# Patient Record
Sex: Female | Born: 1985 | Race: White | Hispanic: No | Marital: Married | Smoking: Former smoker
Health system: Southern US, Community
[De-identification: ages and names within clinical notes are randomized; demographics above are authoritative.]

## PROBLEM LIST (undated history)

## (undated) ENCOUNTER — Inpatient Hospital Stay: Payer: Self-pay

## (undated) DIAGNOSIS — R011 Cardiac murmur, unspecified: Secondary | ICD-10-CM

## (undated) DIAGNOSIS — M549 Dorsalgia, unspecified: Secondary | ICD-10-CM

## (undated) DIAGNOSIS — D649 Anemia, unspecified: Secondary | ICD-10-CM

## (undated) DIAGNOSIS — O139 Gestational [pregnancy-induced] hypertension without significant proteinuria, unspecified trimester: Secondary | ICD-10-CM

## (undated) HISTORY — PX: NO PAST SURGERIES: SHX2092

## (undated) HISTORY — PX: CERVICAL BIOPSY  W/ LOOP ELECTRODE EXCISION: SUR135

## (undated) HISTORY — PX: ABDOMINAL HYSTERECTOMY: SHX81

---

## 2009-06-30 ENCOUNTER — Ambulatory Visit: Payer: Self-pay

## 2009-08-26 ENCOUNTER — Emergency Department: Payer: Self-pay | Admitting: Emergency Medicine

## 2010-05-05 ENCOUNTER — Ambulatory Visit: Payer: Self-pay | Admitting: Family Medicine

## 2010-10-08 ENCOUNTER — Ambulatory Visit: Payer: Self-pay | Admitting: Family Medicine

## 2012-08-18 ENCOUNTER — Emergency Department: Payer: Self-pay | Admitting: Internal Medicine

## 2012-09-29 ENCOUNTER — Emergency Department: Payer: Self-pay | Admitting: Emergency Medicine

## 2013-04-24 ENCOUNTER — Emergency Department: Payer: Self-pay | Admitting: Emergency Medicine

## 2013-04-24 LAB — BASIC METABOLIC PANEL
Anion Gap: 7 (ref 7–16)
BUN: 8 mg/dL (ref 7–18)
CALCIUM: 9 mg/dL (ref 8.5–10.1)
Chloride: 104 mmol/L (ref 98–107)
Co2: 23 mmol/L (ref 21–32)
Creatinine: 0.78 mg/dL (ref 0.60–1.30)
EGFR (African American): >60
EGFR (Non-African Amer.): >60
Glucose: 91 mg/dL (ref 65–99)
OSMOLALITY: 266 (ref 275–301)
Potassium: 3.4 mmol/L — ABNORMAL LOW (ref 3.5–5.1)
SODIUM: 134 mmol/L — AB (ref 136–145)

## 2013-04-24 LAB — CBC WITH DIFFERENTIAL/PLATELET
BASOS PCT: 0.2 %
Basophil #: 0 10*3/uL (ref 0.0–0.1)
Eosinophil #: 0.1 10*3/uL (ref 0.0–0.7)
Eosinophil %: 0.8 %
HCT: 37.2 % (ref 35.0–47.0)
HGB: 12.8 g/dL (ref 12.0–16.0)
Lymphocyte #: 0.5 10*3/uL — ABNORMAL LOW (ref 1.0–3.6)
Lymphocyte %: 5.9 %
MCH: 27.5 pg (ref 26.0–34.0)
MCHC: 34.6 g/dL (ref 32.0–36.0)
MCV: 80 fL (ref 80–100)
MONO ABS: 0.5 x10 3/mm (ref 0.2–0.9)
MONOS PCT: 4.9 %
NEUTROS ABS: 8.2 10*3/uL — AB (ref 1.4–6.5)
NEUTROS PCT: 88.2 %
Platelet: 199 10*3/uL (ref 150–440)
RBC: 4.67 10*6/uL (ref 3.80–5.20)
RDW: 13.4 % (ref 11.5–14.5)
WBC: 9.3 10*3/uL (ref 3.6–11.0)

## 2013-04-24 LAB — RAPID INFLUENZA A&B ANTIGENS (ARMC ONLY)

## 2015-04-19 NOTE — L&D Delivery Note (Signed)
Obstetrical Delivery Note   Date of Delivery:   01/12/2016 Primary OB:   [facility] Gestational Age/EDD: [id number]  Antepartum complications: none Intrapartum complications: None Delivered By:   TKB, CNM Delivery Type:   spontaneous vaginal delivery  Anesthesia:    epidural  GBS:    Negative Laceration:    none Episiotomy:    none Placenta:    Delivered via active management. To pathology: no.  Estimated Blood Loss:  400mL Baby's Name:   Stefanie Mason  Baby:    Liveborn female, APGARs 5/9, weight 3200gm   Delivery Details:   Very quick second stage. Tight nuchal cord, unable to be reduced, infant sommersaulted through cord. Infant to maternal abdomen, cut by FOB after 1 minute as infant required further assessment at bedside warmer.

## 2015-05-07 ENCOUNTER — Encounter: Payer: Self-pay | Admitting: Emergency Medicine

## 2015-05-07 ENCOUNTER — Emergency Department
Admission: EM | Admit: 2015-05-07 | Discharge: 2015-05-07 | Discharge status: Against Medical Advice | Payer: Medicaid Other | Attending: Emergency Medicine | Admitting: Emergency Medicine

## 2015-05-07 ENCOUNTER — Emergency Department
Admission: EM | Admit: 2015-05-07 | Discharge: 2015-05-07 | Disposition: A | Discharge status: Home / Self Care | Payer: Medicaid Other | Attending: Emergency Medicine | Admitting: Emergency Medicine

## 2015-05-07 DIAGNOSIS — R1110 Vomiting, unspecified: Secondary | ICD-10-CM | POA: Insufficient documentation

## 2015-05-07 DIAGNOSIS — R52 Pain, unspecified: Secondary | ICD-10-CM | POA: Diagnosis not present

## 2015-05-07 DIAGNOSIS — R112 Nausea with vomiting, unspecified: Secondary | ICD-10-CM | POA: Insufficient documentation

## 2015-05-07 DIAGNOSIS — R509 Fever, unspecified: Secondary | ICD-10-CM | POA: Diagnosis present

## 2015-05-07 DIAGNOSIS — J069 Acute upper respiratory infection, unspecified: Secondary | ICD-10-CM | POA: Insufficient documentation

## 2015-05-07 HISTORY — DX: Anemia, unspecified: D64.9

## 2015-05-07 LAB — RAPID INFLUENZA A&B ANTIGENS: Influenza A (ARMC): NEGATIVE

## 2015-05-07 LAB — RAPID INFLUENZA A&B ANTIGENS (ARMC ONLY): INFLUENZA B (ARMC): NEGATIVE

## 2015-05-07 MED ORDER — PROMETHAZINE HCL 25 MG PO TABS: 25.0000 mg | ORAL_TABLET | Freq: Four times a day (QID) | ORAL | Status: DC | PRN

## 2015-05-07 MED ORDER — ACETAMINOPHEN 500 MG PO TABS
ORAL_TABLET | ORAL | Status: AC
  Filled 2015-05-07: qty 2

## 2015-05-07 MED ORDER — GUAIFENESIN-CODEINE 100-10 MG/5ML PO SOLN
10.0000 mL | ORAL | Status: DC | PRN
Start: 2015-05-07 — End: 2016-01-09

## 2015-05-07 MED ORDER — CHLORPHENIRAMINE MALEATE 4 MG PO TABS: 4.0000 mg | ORAL_TABLET | Freq: Two times a day (BID) | ORAL | Status: DC | PRN

## 2015-05-07 MED ORDER — AZITHROMYCIN 250 MG PO TABS: ORAL_TABLET | ORAL | Status: DC

## 2015-05-07 MED ORDER — ACETAMINOPHEN 500 MG PO TABS
1000.0000 mg | ORAL_TABLET | Freq: Once | ORAL | Status: AC
  Administered 2015-05-07: 1000 mg via ORAL

## 2015-05-07 NOTE — ED Provider Notes (Signed)
[facility] Emergency Department Provider Note  ____________________________________________  Time seen: Approximately 7:49 AM  I have reviewed the triage vital signs and the nursing notes.   HISTORY  Chief Complaint Influenza    HPI Stefanie Mason is a 30 y.o. female presents for evaluation of fever chills cough weakness vomiting since yesterday 3. Patient states that symptoms were sudden in onset took 2 Advil prior to arrival. Patient states that she felt like this when she had the flu several years ago. Was brought in by EMS at 1:30 this morning but fell sleep in the waiting room and didn't get seen until current time of note.   Past Medical History  Diagnosis Date  . Anemia     There are no active problems to display for this patient.   History reviewed. No pertinent past surgical history.  Current Outpatient Rx  Name  Route  Sig  Dispense  Refill  . azithromycin (ZITHROMAX Z-PAK) 250 MG tablet      Take 2 tablets (500 mg) on  Day 1,  followed by 1 tablet (250 mg) once daily on Days 2 through 5.   6 each   0   . chlorpheniramine (CHLOR-TRIMETON) 4 MG tablet   Oral   Take 1 tablet (4 mg total) by mouth 2 (two) times daily as needed for allergies or rhinitis.   30 tablet   0   . guaiFENesin-codeine 100-10 MG/5ML syrup   Oral   Take 10 mLs by mouth every 4 (four) hours as needed for cough.   180 mL   0   . promethazine (PHENERGAN) 25 MG tablet   Oral   Take 1 tablet (25 mg total) by mouth every 6 (six) hours as needed for nausea or vomiting.   8 tablet   0     Allergies Review of patient's allergies indicates no known allergies.  No family history on file.  Social History Social History  Substance Use Topics  . Smoking status: Never Smoker   . Smokeless tobacco: None  . Alcohol Use: No    Review of Systems Constitutional: Positive fever/chills Eyes: No visual changes. ENT: Minimal sore throat. Cardiovascular: Denies  chest pain. Respiratory: Denies shortness of breath. Positive for cough Gastrointestinal: No abdominal pain.  Positive nausea, positive vomiting.  No diarrhea.  No constipation. Genitourinary: Negative for dysuria. Musculoskeletal: Negative for back pain. Skin: Negative for rash. Neurological: Positive for headaches, negative for focal weakness or numbness.  10-point ROS otherwise negative.  ____________________________________________   PHYSICAL EXAM:  VITAL SIGNS: ED Triage Vitals  Enc Vitals Group     BP 05/07/15 0732 127/71 mmHg     Pulse Rate 05/07/15 0732 115     Resp 05/07/15 0732 18     Temp 05/07/15 0732 100.1 F (37.8 C)     Temp Source 05/07/15 0732 Oral     SpO2 05/07/15 0732 100 %     Weight 05/07/15 0732 135 lb (61.236 kg)     Height 05/07/15 0732 5' (1.524 m)     Head Cir --      Peak Flow --      Pain Score 05/07/15 0732 7     Pain Loc --      Pain Edu? --      Excl. in GC? --     Constitutional: Alert and oriented. Well appearing and in no acute distress. Eyes: Conjunctivae are normal. PERRL. EOMI. Head: Atraumatic. Nose: Positive congestion/rhinnorhea. Mouth/Throat: Mucous membranes are  moist.  Oropharynx non-erythematous. Neck: No stridor.   Cardiovascular: Normal rate, regular rhythm. Grossly normal heart sounds.  Good peripheral circulation. Respiratory: Normal respiratory effort.  No retractions. Lungs CTAB. Gastrointestinal: Soft and nontender. No distention. No abdominal bruits. No CVA tenderness. Musculoskeletal: No lower extremity tenderness nor edema.  No joint effusions. Neurologic:  Normal speech and language. No gross focal neurologic deficits are appreciated. No gait instability. Skin:  Skin is warm, dry and intact. No rash noted. Psychiatric: Mood and affect are normal. Speech and behavior are normal.  ____________________________________________   LABS (all labs ordered are listed, but only abnormal results are displayed)  Labs  Reviewed  RAPID INFLUENZA A&B ANTIGENS ([facility] ONLY)   ____________________________________________    PROCEDURES  Procedure(s) performed: None  Critical Care performed: No  ____________________________________________   INITIAL IMPRESSION / ASSESSMENT AND PLAN / ED COURSE  Pertinent labs & imaging results that were available during my care of the patient were reviewed by me and considered in my medical decision making (see chart for details).  Acute upper respiratory infection. Rx Zithromax, chlorpheniramine, Robitussin-AC, and Phenergan. Patient follow-up with PCP or return to the ER with any worsening symptomology.  Patient voices no other emergency medical complaints at this time. ____________________________________________   FINAL CLINICAL IMPRESSION(S) / ED DIAGNOSES  Final diagnoses:  URI, acute      Evangeline Dakin, PA-C 05/07/15 0902  Richardean Canal, MD 05/07/15 1520

## 2015-05-07 NOTE — ED Notes (Addendum)
Pt came in by ems and fell asleep in lobby, missing her call. She states she has been having chills, weakness, coughing, vomiting since yesterday (x3). No coughing during assessment. Pt alert & oriented. Pt took 2 advil pta (before 0130).  States last time she felt like this she had the flu (2-3 years ago).

## 2015-05-07 NOTE — Discharge Instructions (Signed)
Upper Respiratory Infection, Adult Most upper respiratory infections (URIs) are a viral infection of the air passages leading to the lungs. A URI affects the nose, throat, and upper air passages. The most common type of URI is nasopharyngitis and is typically referred to as "the common cold." URIs run their course and usually go away on their own. Most of the time, a URI does not require medical attention, but sometimes a bacterial infection in the upper airways can follow a viral infection. This is called a secondary infection. Sinus and middle ear infections are common types of secondary upper respiratory infections. Bacterial pneumonia can also complicate a URI. A URI can worsen asthma and chronic obstructive pulmonary disease (COPD). Sometimes, these complications can require emergency medical care and may be life threatening.  CAUSES Almost all URIs are caused by viruses. A virus is a type of germ and can spread from one person to another.  RISKS FACTORS You may be at risk for a URI if:   You smoke.   You have chronic heart or lung disease.  You have a weakened defense (immune) system.   You are very young or very old.   You have nasal allergies or asthma.  You work in crowded or poorly ventilated areas.  You work in health care facilities or schools. SIGNS AND SYMPTOMS  Symptoms typically develop 2-3 days after you come in contact with a cold virus. Most viral URIs last 7-10 days. However, viral URIs from the influenza virus (flu virus) can last 14-18 days and are typically more severe. Symptoms may include:   Runny or stuffy (congested) nose.   Sneezing.   Cough.   Sore throat.   Headache.   Fatigue.   Fever.   Loss of appetite.   Pain in your forehead, behind your eyes, and over your cheekbones (sinus pain).  Muscle aches.  DIAGNOSIS  Your health care provider may diagnose a URI by:  Physical exam.  Tests to check that your symptoms are not due to  another condition such as:  Strep throat.  Sinusitis.  Pneumonia.  Asthma. TREATMENT  A URI goes away on its own with time. It cannot be cured with medicines, but medicines may be prescribed or recommended to relieve symptoms. Medicines may help:  Reduce your fever.  Reduce your cough.  Relieve nasal congestion. HOME CARE INSTRUCTIONS   Take medicines only as directed by your health care provider.   Gargle warm saltwater or take cough drops to comfort your throat as directed by your health care provider.  Use a warm mist humidifier or inhale steam from a shower to increase air moisture. This may make it easier to breathe.  Drink enough fluid to keep your urine clear or pale yellow.   Eat soups and other clear broths and maintain good nutrition.   Rest as needed.   Return to work when your temperature has returned to normal or as your health care provider advises. You may need to stay home longer to avoid infecting others. You can also use a face mask and careful hand washing to prevent spread of the virus.  Increase the usage of your inhaler if you have asthma.   Do not use any tobacco products, including cigarettes, chewing tobacco, or electronic cigarettes. If you need help quitting, ask your health care provider. PREVENTION  The best way to protect yourself from getting a cold is to practice good hygiene.   Avoid oral or hand contact with people with cold   symptoms.   Wash your hands often if contact occurs.  There is no clear evidence that vitamin C, vitamin E, echinacea, or exercise reduces the chance of developing a cold. However, it is always recommended to get plenty of rest, exercise, and practice good nutrition.  SEEK MEDICAL CARE IF:   You are getting worse rather than better.   Your symptoms are not controlled by medicine.   You have chills.  You have worsening shortness of breath.  You have brown or red mucus.  You have yellow or brown nasal  discharge.  You have pain in your face, especially when you bend forward.  You have a fever.  You have swollen neck glands.  You have pain while swallowing.  You have white areas in the back of your throat. SEEK IMMEDIATE MEDICAL CARE IF:   You have severe or persistent:  Headache.  Ear pain.  Sinus pain.  Chest pain.  You have chronic lung disease and any of the following:  Wheezing.  Prolonged cough.  Coughing up blood.  A change in your usual mucus.  You have a stiff neck.  You have changes in your:  Vision.  Hearing.  Thinking.  Mood. MAKE SURE YOU:   Understand these instructions.  Will watch your condition.  Will get help right away if you are not doing well or get worse.   This information is not intended to replace advice given to you by your health care provider. Make sure you discuss any questions you have with your health care provider.   Document Released: 09/28/2000 Document Revised: 08/19/2014 Document Reviewed: 07/10/2013 Elsevier Interactive Patient Education 2016 [employer]  

## 2015-05-07 NOTE — ED Notes (Signed)
Pt states she has been feeling like she was getting sick all day today. This evening her roommate said she felt a little warm so she walked approx 10 minutes to [employer] "to get some fresh air and dog food". Pt states when she got there she wasn't feeling any better so she called EMS to be evaluated. Vomited 2X  Earlier in the evening and reports having body aches as well. Pt alert and calm at this time with no increased work of breathing or acute distress noted.

## 2015-05-07 NOTE — ED Notes (Signed)
Pt discharged home after verbalizing understanding of discharge instructions; nad noted. 

## 2015-05-07 NOTE — ED Notes (Signed)
presents with body aches with some fever and chills since yesterday

## 2015-05-07 NOTE — ED Notes (Signed)
Pt was brought to ED by ems this am around 0130 am  Was asleep in lobby when called   Woke up around 0715

## 2015-06-10 ENCOUNTER — Emergency Department
Admission: EM | Admit: 2015-06-10 | Discharge: 2015-06-10 | Disposition: A | Discharge status: Home / Self Care | Payer: Medicaid Other | Attending: Emergency Medicine | Admitting: Emergency Medicine

## 2015-06-10 ENCOUNTER — Emergency Department: Payer: Medicaid Other

## 2015-06-10 ENCOUNTER — Encounter: Payer: Self-pay | Admitting: Emergency Medicine

## 2015-06-10 DIAGNOSIS — O200 Threatened abortion: Secondary | ICD-10-CM | POA: Insufficient documentation

## 2015-06-10 DIAGNOSIS — Z3A01 Less than 8 weeks gestation of pregnancy: Secondary | ICD-10-CM | POA: Diagnosis not present

## 2015-06-10 DIAGNOSIS — N939 Abnormal uterine and vaginal bleeding, unspecified: Secondary | ICD-10-CM

## 2015-06-10 DIAGNOSIS — O418X10 Other specified disorders of amniotic fluid and membranes, first trimester, not applicable or unspecified: Secondary | ICD-10-CM | POA: Insufficient documentation

## 2015-06-10 DIAGNOSIS — O468X1 Other antepartum hemorrhage, first trimester: Secondary | ICD-10-CM

## 2015-06-10 DIAGNOSIS — O209 Hemorrhage in early pregnancy, unspecified: Secondary | ICD-10-CM | POA: Diagnosis present

## 2015-06-10 DIAGNOSIS — Z792 Long term (current) use of antibiotics: Secondary | ICD-10-CM | POA: Insufficient documentation

## 2015-06-10 LAB — HCG, QUANTITATIVE, PREGNANCY: hCG, Beta Chain, Quant, S: 45292 m[IU]/mL — ABNORMAL HIGH (ref <5)

## 2015-06-10 LAB — OB RESULTS CONSOLE GC/CHLAMYDIA
Chlamydia: NEGATIVE
Gonorrhea: NEGATIVE

## 2015-06-10 LAB — URINALYSIS COMPLETE WITH MICROSCOPIC (ARMC ONLY)
BILIRUBIN URINE: NEGATIVE
GLUCOSE, UA: NEGATIVE mg/dL
Ketones, ur: NEGATIVE mg/dL
NITRITE: NEGATIVE
Protein, ur: NEGATIVE mg/dL
SPECIFIC GRAVITY, URINE: 1.009 (ref 1.005–1.030)
pH: 6 (ref 5.0–8.0)

## 2015-06-10 LAB — CBC WITH DIFFERENTIAL/PLATELET
Basophils Absolute: 0.1 10*3/uL (ref 0–0.1)
Basophils Relative: 1 %
EOS PCT: 2 %
Eosinophils Absolute: 0.2 10*3/uL (ref 0–0.7)
HCT: 38 % (ref 35.0–47.0)
Hemoglobin: 13 g/dL (ref 12.0–16.0)
LYMPHS ABS: 2.3 10*3/uL (ref 1.0–3.6)
LYMPHS PCT: 20 %
MCH: 27.8 pg (ref 26.0–34.0)
MCHC: 34.1 g/dL (ref 32.0–36.0)
MCV: 81.7 fL (ref 80.0–100.0)
MONO ABS: 0.6 10*3/uL (ref 0.2–0.9)
Monocytes Relative: 5 %
Neutro Abs: 8.5 10*3/uL — ABNORMAL HIGH (ref 1.4–6.5)
Neutrophils Relative %: 72 %
PLATELETS: 242 10*3/uL (ref 150–440)
RBC: 4.66 MIL/uL (ref 3.80–5.20)
RDW: 14.1 % (ref 11.5–14.5)
WBC: 11.7 10*3/uL — ABNORMAL HIGH (ref 3.6–11.0)

## 2015-06-10 LAB — WET PREP, GENITAL
Clue Cells Wet Prep HPF POC: NONE SEEN
Sperm: NONE SEEN
Trich, Wet Prep: NONE SEEN
Yeast Wet Prep HPF POC: NONE SEEN

## 2015-06-10 LAB — CHLAMYDIA/NGC RT PCR (ARMC ONLY)
CHLAMYDIA TR: NOT DETECTED
N GONORRHOEAE: NOT DETECTED

## 2015-06-10 LAB — ABO/RH: ABO/RH(D): A NEG

## 2015-06-10 LAB — ANTIBODY SCREEN: ANTIBODY SCREEN: NEGATIVE

## 2015-06-10 MED ORDER — RHO D IMMUNE GLOBULIN 1500 UNIT/2ML IJ SOSY
300.0000 ug | PREFILLED_SYRINGE | Freq: Once | INTRAMUSCULAR | Status: AC
  Administered 2015-06-10: 300 ug via INTRAMUSCULAR
  Filled 2015-06-10: qty 2

## 2015-06-10 NOTE — ED Provider Notes (Signed)
[facility] Emergency Department Provider Note  ____________________________________________  Time seen: Approximately 508 AM  I have reviewed the triage vital signs and the nursing notes.   HISTORY  Chief Complaint Vaginal Bleeding    HPI Stefanie Mason is a 30 y.o. female who comes into the hospital today with vaginal bleeding. The patient reports that with ago she discovered she was pregnant. She went to the shower around 11:30 and when she came out of the shower she had some significant vaginal bleeding. She reports that it was not severely heavy but enough to make her worried. The patient is a G4 P2 012 with a previous miscarriage. She has some mild cramping in her lower abdomen she rates a 6-7 out of 10 in intensity. The patient and her spouse was concerned so they brought her into the hospital. She did not take any medication for pain and she reports the bleeding has improved at this time.   Past Medical History  Diagnosis Date  . Anemia     There are no active problems to display for this patient.   History reviewed. No pertinent past surgical history.  Current Outpatient Rx  Name  Route  Sig  Dispense  Refill  . azithromycin (ZITHROMAX Z-PAK) 250 MG tablet      Take 2 tablets (500 mg) on  Day 1,  followed by 1 tablet (250 mg) once daily on Days 2 through 5.   6 each   0   . chlorpheniramine (CHLOR-TRIMETON) 4 MG tablet   Oral   Take 1 tablet (4 mg total) by mouth 2 (two) times daily as needed for allergies or rhinitis.   30 tablet   0   . guaiFENesin-codeine 100-10 MG/5ML syrup   Oral   Take 10 mLs by mouth every 4 (four) hours as needed for cough.   180 mL   0   . promethazine (PHENERGAN) 25 MG tablet   Oral   Take 1 tablet (25 mg total) by mouth every 6 (six) hours as needed for nausea or vomiting.   8 tablet   0     Allergies Review of patient's allergies indicates no known allergies.  History reviewed. No pertinent  family history.  Social History Social History  Substance Use Topics  . Smoking status: Never Smoker   . Smokeless tobacco: None  . Alcohol Use: No    Review of Systems Constitutional: No fever/chills Eyes: No visual changes. ENT: No sore throat. Cardiovascular: Denies chest pain. Respiratory: Denies shortness of breath. Gastrointestinal: Abdominal cramping Genitourinary: Vaginal bleeding Musculoskeletal: Negative for back pain. Skin: Negative for rash. Neurological: Negative for headaches, focal weakness or numbness.  10-point ROS otherwise negative.  ____________________________________________   PHYSICAL EXAM:  VITAL SIGNS: ED Triage Vitals  Enc Vitals Group     BP 06/10/15 0155 113/65 mmHg     Pulse Rate 06/10/15 0155 84     Resp 06/10/15 0155 18     Temp 06/10/15 0155 98.4 F (36.9 C)     Temp Source 06/10/15 0155 Oral     SpO2 06/10/15 0155 100 %     Weight 06/10/15 0155 130 lb (58.968 kg)     Height 06/10/15 0155 5' (1.524 m)     Head Cir --      Peak Flow --      Pain Score 06/10/15 0144 7     Pain Loc --      Pain Edu? --  Excl. in GC? --     Constitutional: Alert and oriented. Well appearing and in mild distress. Eyes: Conjunctivae are normal. PERRL. EOMI. Head: Atraumatic. Nose: No congestion/rhinnorhea. Mouth/Throat: Mucous membranes are moist.  Oropharynx non-erythematous. Cardiovascular: Normal rate, regular rhythm. Grossly normal heart sounds.  Good peripheral circulation. Respiratory: Normal respiratory effort.  No retractions. Lungs CTAB. Gastrointestinal: Soft and nontender. No distention. Positive bowel sounds Genitourinary: Mild blood in the vaginal vault, cervix is closed with no cervical motion tenderness. Mild tenderness to palpation over the uterus. Musculoskeletal: No lower extremity tenderness nor edema.   Neurologic:  Normal speech and language.  Skin:  Skin is warm, dry and intact.  Psychiatric: Mood and affect are normal.    ____________________________________________   LABS (all labs ordered are listed, but only abnormal results are displayed)  Labs Reviewed  WET PREP, GENITAL - Abnormal; Notable for the following:    WBC, Wet Prep HPF POC MODERATE (*)    All other components within normal limits  URINALYSIS COMPLETEWITH MICROSCOPIC ([facility] ONLY) - Abnormal; Notable for the following:    Color, Urine STRAW (*)    APPearance CLEAR (*)    Hgb urine dipstick 3+ (*)    Leukocytes, UA 1+ (*)    Bacteria, UA RARE (*)    Squamous Epithelial / LPF 0-5 (*)    All other components within normal limits  HCG, QUANTITATIVE, PREGNANCY - Abnormal; Notable for the following:    hCG, Beta Chain, Quant, S [id number]    All other components within normal limits  CBC WITH DIFFERENTIAL/PLATELET - Abnormal; Notable for the following:    WBC 11.7 (*)    Neutro Abs 8.5 (*)    All other components within normal limits  CHLAMYDIA/NGC RT PCR ([facility] ONLY)  ABO/RH   ____________________________________________  EKG  None ____________________________________________  RADIOLOGY  Vaginal ultrasound: Single live intrauterine pregnancy noted with a crown-rump length of 5 mm corresponding to a gestational age of [redacted] weeks 2 days. This matches the gestational age of [redacted] weeks 3 days. Moderate to large amount of subchorionic hemorrhage noted likely corresponding to the patient's vaginal bleeding. ____________________________________________   PROCEDURES  Procedure(s) performed: None  Critical Care performed: No  ____________________________________________   INITIAL IMPRESSION / ASSESSMENT AND PLAN / ED COURSE  Pertinent labs & imaging results that were available during my care of the patient were reviewed by me and considered in my medical decision making (see chart for details).  This is a 30 year old female who comes in today she is [redacted] weeks pregnant and is having some vaginal bleeding. She does have some  subchorionic hemorrhage and I discussed with her that there is a 50-50 risk of miscarriage with bleeding in pregnancy. The patient's blood type is A-'s I will give her a gram shot and I will discharge her to follow up with OB/GYN. ____________________________________________   FINAL CLINICAL IMPRESSION(S) / ED DIAGNOSES  Final diagnoses:  Subchorionic hemorrhage, first trimester  Threatened miscarriage      Rebecka Apley, MD 06/10/15 [phone]

## 2015-06-10 NOTE — ED Notes (Signed)
Pt reports vaginal bleeding, pt is 5-[redacted] weeks pregnant. Pt reports abdominal cramping.

## 2015-06-10 NOTE — Discharge Instructions (Signed)
Subchorionic Hematoma A subchorionic hematoma is a gathering of blood between the outer wall of the placenta and the inner wall of the womb (uterus). The placenta is the organ that connects the fetus to the wall of the uterus. The placenta performs the feeding, breathing (oxygen to the fetus), and waste removal (excretory work) of the fetus.  Subchorionic hematoma is the most common abnormality found on a result from ultrasonography done during the first trimester or early second trimester of pregnancy. If there has been little or no vaginal bleeding, early small hematomas usually shrink on their own and do not affect your baby or pregnancy. The blood is gradually absorbed over 1-2 weeks. When bleeding starts later in pregnancy or the hematoma is larger or occurs in an older pregnant woman, the outcome may not be as good. Larger hematomas may get bigger, which increases the chances for miscarriage. Subchorionic hematoma also increases the risk of premature detachment of the placenta from the uterus, preterm (premature) labor, and stillbirth. HOME CARE INSTRUCTIONS  Stay on bed rest if your health care provider recommends this. Although bed rest will not prevent more bleeding or prevent a miscarriage, your health care provider may recommend bed rest until you are advised otherwise.  Avoid heavy lifting (more than 10 lb [4.5 kg]), exercise, sexual intercourse, or douching as directed by your health care provider.  Keep track of the number of pads you use each day and how soaked (saturated) they are. Write down this information.  Do not use tampons.  Keep all follow-up appointments as directed by your health care provider. Your health care provider may ask you to have follow-up blood tests or ultrasound tests or both. SEEK IMMEDIATE MEDICAL CARE IF:  You have severe cramps in your stomach, back, abdomen, or pelvis.  You have a fever.  You pass large clots or tissue. Save any tissue for your health  care provider to look at.  Your bleeding increases or you become lightheaded, feel weak, or have fainting episodes.   This information is not intended to replace advice given to you by your health care provider. Make sure you discuss any questions you have with your health care provider.   Document Released: 07/20/2006 Document Revised: 04/25/2014 Document Reviewed: 11/01/2012 Elsevier Interactive Patient Education 2016 [employer]  Threatened Miscarriage A threatened miscarriage occurs when you have vaginal bleeding during your first 20 weeks of pregnancy but the pregnancy has not ended. If you have vaginal bleeding during this time, your health care provider will do tests to make sure you are still pregnant. If the tests show you are still pregnant and the developing baby (fetus) inside your womb (uterus) is still growing, your condition is considered a threatened miscarriage. A threatened miscarriage does not mean your pregnancy will end, but it does increase the risk of losing your pregnancy (complete miscarriage). CAUSES  The cause of a threatened miscarriage is usually not known. If you go on to have a complete miscarriage, the most common cause is an abnormal number of chromosomes in the developing baby. Chromosomes are the structures inside cells that hold all your genetic material. Some causes of vaginal bleeding that do not result in miscarriage include:  Having sex.  Having an infection.  Normal hormone changes of pregnancy.  Bleeding that occurs when an egg implants in your uterus. RISK FACTORS Risk factors for bleeding in early pregnancy include:  Obesity.  Smoking.  Drinking excessive amounts of alcohol or caffeine.  Recreational drug use. SIGNS  AND SYMPTOMS  Light vaginal bleeding.  Mild abdominal pain or cramps. DIAGNOSIS  If you have bleeding with or without abdominal pain before 20 weeks of pregnancy, your health care provider will do tests to check whether  you are still pregnant. One important test involves using sound waves and a computer (ultrasound) to create images of the inside of your uterus. Other tests include an internal exam of your vagina and uterus (pelvic exam) and measurement of your baby's heart rate.  You may be diagnosed with a threatened miscarriage if:  Ultrasound testing shows you are still pregnant.  Your baby's heart rate is strong.  A pelvic exam shows that the opening between your uterus and your vagina (cervix) is closed.  Your heart rate and blood pressure are stable.  Blood tests confirm you are still pregnant. TREATMENT  No treatments have been shown to prevent a threatened miscarriage from going on to a complete miscarriage. However, the right home care is important.  HOME CARE INSTRUCTIONS   Make sure you keep all your appointments for prenatal care. This is very important.  Get plenty of rest.  Do not have sex or use tampons if you have vaginal bleeding.  Do not douche.  Do not smoke or use recreational drugs.  Do not drink alcohol.  Avoid caffeine. SEEK MEDICAL CARE IF:  You have light vaginal bleeding or spotting while pregnant.  You have abdominal pain or cramping.  You have a fever. SEEK IMMEDIATE MEDICAL CARE IF:  You have heavy vaginal bleeding.  You have blood clots coming from your vagina.  You have severe low back pain or abdominal cramps.  You have fever, chills, and severe abdominal pain. MAKE SURE YOU:  Understand these instructions.  Will watch your condition.  Will get help right away if you are not doing well or get worse.   This information is not intended to replace advice given to you by your health care provider. Make sure you discuss any questions you have with your health care provider.   Document Released: 04/04/2005 Document Revised: 04/09/2013 Document Reviewed: 01/29/2013 Elsevier Interactive Patient Education 2016 Elsevier Inc.  Vaginal Bleeding During  Pregnancy, First Trimester A small amount of bleeding (spotting) from the vagina is common in early pregnancy. Sometimes the bleeding is normal and is not a problem, and sometimes it is a sign of something serious. Be sure to tell your doctor about any bleeding from your vagina right away. HOME CARE  Watch your condition for any changes.  Follow your doctor's instructions about how active you can be.  If you are on bed rest:  You may need to stay in bed and only get up to use the bathroom.  You may be allowed to do some activities.  If you need help, make plans for someone to help you.  Write down:  The number of pads you use each day.  How often you change pads.  How soaked (saturated) your pads are.  Do not use tampons.  Do not douche.  Do not have sex or orgasms until your doctor says it is okay.  If you pass any tissue from your vagina, save the tissue so you can show it to your doctor.  Only take medicines as told by your doctor.  Do not take aspirin because it can make you bleed.  Keep all follow-up visits as told by your doctor. GET HELP IF:   You bleed from your vagina.  You have cramps.  You  have labor pains.  You have a fever that does not go away after you take medicine. GET HELP RIGHT AWAY IF:   You have very bad cramps in your back or belly (abdomen).  You pass large clots or tissue from your vagina.  You bleed more.  You feel light-headed or weak.  You pass out (faint).  You have chills.  You are leaking fluid or have a gush of fluid from your vagina.  You pass out while pooping (having a bowel movement). MAKE SURE YOU:  Understand these instructions.  Will watch your condition.  Will get help right away if you are not doing well or get worse.   This information is not intended to replace advice given to you by your health care provider. Make sure you discuss any questions you have with your health care provider.   Document  Released: 08/19/2013 Document Reviewed: 08/19/2013 Elsevier Interactive Patient Education [employer]

## 2015-06-11 LAB — RHOGAM INJECTION: Unit division: 0

## 2015-06-25 LAB — OB RESULTS CONSOLE RPR: RPR: NONREACTIVE

## 2015-06-25 LAB — OB RESULTS CONSOLE RUBELLA ANTIBODY, IGM: Rubella: IMMUNE

## 2015-06-25 LAB — OB RESULTS CONSOLE VARICELLA ZOSTER ANTIBODY, IGG: VARICELLA IGG: IMMUNE

## 2015-06-25 LAB — OB RESULTS CONSOLE HEPATITIS B SURFACE ANTIGEN: Hepatitis B Surface Ag: NEGATIVE

## 2015-10-24 ENCOUNTER — Observation Stay
Admission: EM | Admit: 2015-10-24 | Discharge: 2015-10-24 | Disposition: A | Discharge status: Home / Self Care | Payer: Medicaid Other | Attending: Obstetrics & Gynecology | Admitting: Obstetrics & Gynecology

## 2015-10-24 ENCOUNTER — Encounter: Payer: Self-pay | Admitting: Obstetrics & Gynecology

## 2015-10-24 DIAGNOSIS — Y9389 Activity, other specified: Secondary | ICD-10-CM | POA: Insufficient documentation

## 2015-10-24 DIAGNOSIS — Z3A24 24 weeks gestation of pregnancy: Secondary | ICD-10-CM | POA: Insufficient documentation

## 2015-10-24 DIAGNOSIS — S39012A Strain of muscle, fascia and tendon of lower back, initial encounter: Secondary | ICD-10-CM | POA: Insufficient documentation

## 2015-10-24 DIAGNOSIS — Z87891 Personal history of nicotine dependence: Secondary | ICD-10-CM | POA: Insufficient documentation

## 2015-10-24 DIAGNOSIS — Y998 Other external cause status: Secondary | ICD-10-CM | POA: Insufficient documentation

## 2015-10-24 DIAGNOSIS — X500XXA Overexertion from strenuous movement or load, initial encounter: Secondary | ICD-10-CM | POA: Insufficient documentation

## 2015-10-24 DIAGNOSIS — Y9289 Other specified places as the place of occurrence of the external cause: Secondary | ICD-10-CM | POA: Insufficient documentation

## 2015-10-24 DIAGNOSIS — O9989 Other specified diseases and conditions complicating pregnancy, childbirth and the puerperium: Principal | ICD-10-CM | POA: Insufficient documentation

## 2015-10-24 HISTORY — DX: Gestational (pregnancy-induced) hypertension without significant proteinuria, unspecified trimester: O13.9

## 2015-10-24 LAB — URINALYSIS COMPLETE WITH MICROSCOPIC (ARMC ONLY)
BILIRUBIN URINE: NEGATIVE
Glucose, UA: NEGATIVE mg/dL
Hgb urine dipstick: NEGATIVE
KETONES UR: NEGATIVE mg/dL
Nitrite: NEGATIVE
PROTEIN: NEGATIVE mg/dL
Specific Gravity, Urine: 1.008 (ref 1.005–1.030)
pH: 8 (ref 5.0–8.0)

## 2015-10-24 LAB — URINE DRUG SCREEN, QUALITATIVE (ARMC ONLY)
AMPHETAMINES, UR SCREEN: NOT DETECTED
Barbiturates, Ur Screen: NOT DETECTED
Benzodiazepine, Ur Scrn: NOT DETECTED
Cannabinoid 50 Ng, Ur ~~LOC~~: NOT DETECTED
Cocaine Metabolite,Ur ~~LOC~~: NOT DETECTED
MDMA (ECSTASY) UR SCREEN: NOT DETECTED
METHADONE SCREEN, URINE: NOT DETECTED
OPIATE, UR SCREEN: NOT DETECTED
PHENCYCLIDINE (PCP) UR S: NOT DETECTED
Tricyclic, Ur Screen: NOT DETECTED

## 2015-10-24 MED ORDER — ACETAMINOPHEN 500 MG PO TABS: 1000.0000 mg | ORAL_TABLET | Freq: Four times a day (QID) | ORAL | Status: DC | PRN

## 2015-10-24 MED ORDER — IBUPROFEN 600 MG PO TABS
600.0000 mg | ORAL_TABLET | Freq: Four times a day (QID) | ORAL | Status: DC
  Administered 2015-10-24: 600 mg via ORAL
  Filled 2015-10-24: qty 1

## 2015-10-24 NOTE — OB Triage Note (Signed)
[place]. [place]a [place] around [redacted] weeks pregnant came in today after pulling back muscle and decrease fetal movement thereafter around 2pm today. Patient reported when she got out of her car today she felt "her back muscle pull". Her pain is a 8 out of 10.

## 2015-10-24 NOTE — Discharge Summary (Signed)
Patient discharged home, discharge instructions given, patient states understanding. Patient left floor in stable condition, denies any other needs at this time. Patient to keep next scheduled OB appointment 

## 2015-10-24 NOTE — Discharge Summary (Signed)
Stefanie FootCassandra Mason is a 30 y.o. female. She is at 10670w6d gestation.  Chief Complaint:  S: Resting comfortably. no CTX, no VB.no LOF,  Active fetal movement.   Location: right back Context:  Was getting out of a car and "pulled my back muscle" Onset/timing/duration:  Acute, ongoing since 2hrs ago Quality: sharp, aching Severity: 8/10 Aggravating factors: movement Alleviating factors:  rest Associated signs/symptoms:  No loss of motion   Maternal Medical History:   Past Medical History  Diagnosis Date  . Anemia   . Pregnancy induced hypertension     Past Surgical History  Procedure Laterality Date  . No past surgeries      No Known Allergies  Prior to Admission medications   Medication Sig Start Date End Date Taking? Authorizing Provider  Prenatal Vit-Fe Fumarate-FA (PRENATAL MULTIVITAMIN) TABS tablet Take 1 tablet by mouth daily at 12 noon.   Yes Historical Provider, MD  azithromycin (ZITHROMAX Z-PAK) 250 MG tablet Take 2 tablets (500 mg) on  Day 1,  followed by 1 tablet (250 mg) once daily on Days 2 through 5. Patient not taking: Reported on 10/24/2015 05/07/15   Charmayne Sheerharles M Beers, PA-C  chlorpheniramine (CHLOR-TRIMETON) 4 MG tablet Take 1 tablet (4 mg total) by mouth 2 (two) times daily as needed for allergies or rhinitis. Patient not taking: Reported on 10/24/2015 05/07/15   Charmayne Sheerharles M Beers, PA-C  guaiFENesin-codeine 100-10 MG/5ML syrup Take 10 mLs by mouth every 4 (four) hours as needed for cough. Patient not taking: Reported on 10/24/2015 05/07/15   Evangeline Dakinharles M Beers, PA-C  promethazine (PHENERGAN) 25 MG tablet Take 1 tablet (25 mg total) by mouth every 6 (six) hours as needed for nausea or vomiting. Patient not taking: Reported on 10/24/2015 05/07/15   Evangeline Dakinharles M Beers, PA-C     [facility]   Social History: She  reports that she has quit smoking. She does not have any smokeless tobacco history on file. She reports that she does not drink alcohol or use illicit  drugs.  Family History: family history includes Diabetes in her maternal grandmother; Heart disease in her maternal grandmother; Stroke in her mother.   Review of Systems: A full review of systems was performed and negative except as noted in the HPI.     O:  BP 122/66 mmHg  Pulse 100  Temp(Src) 98.6 F (37 C) (Oral)  Resp 18  LMP 05/03/2015 Results for orders placed or performed during the hospital encounter of 10/24/15 (from the past 48 hour(s))  Urinalysis complete, with microscopic [facility] only)   Collection Time: 10/24/15  5:49 PM  Result Value Ref Range   Color, Urine YELLOW (A) YELLOW   APPearance CLEAR (A) CLEAR   Glucose, UA NEGATIVE NEGATIVE mg/dL   Bilirubin Urine NEGATIVE NEGATIVE   Ketones, ur NEGATIVE NEGATIVE mg/dL   Specific Gravity, Urine 1.008 1.005 - 1.030   Hgb urine dipstick NEGATIVE NEGATIVE   pH 8.0 5.0 - 8.0   Protein, ur NEGATIVE NEGATIVE mg/dL   Nitrite NEGATIVE NEGATIVE   Leukocytes, UA TRACE (A) NEGATIVE   RBC / HPF 0-5 0 - 5 RBC/hpf   WBC, UA 0-5 0 - 5 WBC/hpf   Bacteria, UA RARE (A) NONE SEEN   Squamous Epithelial / LPF 0-5 (A) NONE SEEN   Mucous PRESENT   Urine Drug Screen, Qualitative ([facility] only)   Collection Time: 10/24/15  5:49 PM  Result Value Ref Range   Tricyclic, Ur Screen NONE DETECTED NONE DETECTED  Amphetamines, Ur Screen NONE DETECTED NONE DETECTED   MDMA (Ecstasy)Ur Screen NONE DETECTED NONE DETECTED   Cocaine Metabolite,Ur [place] NONE DETECTED NONE DETECTED   Opiate, Ur Screen NONE DETECTED NONE DETECTED   Phencyclidine (PCP) Ur S NONE DETECTED NONE DETECTED   Cannabinoid 50 Ng, Ur [place] NONE DETECTED NONE DETECTED   Barbiturates, Ur Screen NONE DETECTED NONE DETECTED   Benzodiazepine, Ur Scrn NONE DETECTED NONE DETECTED   Methadone Scn, Ur NONE DETECTED NONE DETECTED     Constitutional: NAD, AAOx3  HE/ENT: extraocular movements grossly intact, moist mucous membranes CV: RRR PULM: nl respiratory effort, CTABL     Abd: gravid,  non-tender, non-distended, soft      Ext: Non-tender, Nonedmeatous Back: no point tenderness, no ecchymosis, no decreased range of motion.   Psych: mood appropriate, speech normal Pelvic: deferred  FHT: 145 mod 10x10 accels, no decels TOCO: quiet    A/P:  29yo [id number] @ 24 weeks with back strain  Labor: not present.   Fetal Wellbeing: Reassuring Cat 1 tracing.  Ibuprofen given, with K-pad for heat followed by ice pack for cool.  Recommend cooling since this offered most relief to patient.  D/c home stable, precautions reviewed, follow-up as scheduled.   ----- Ranae Plumber, MD Attending Obstetrician and Gynecologist [facility]

## 2016-01-09 ENCOUNTER — Inpatient Hospital Stay
Admission: RE | Admit: 2016-01-09 | Discharge: 2016-01-09 | Disposition: A | Discharge status: Home / Self Care | Payer: Medicaid Other | Attending: Obstetrics and Gynecology | Admitting: Obstetrics and Gynecology

## 2016-01-09 NOTE — Final Progress Note (Signed)
Physician Final Progress Note  Patient ID: Stefanie FootCassandra Mason MRN: [id number] DOB/AGE: [birth year] 28 y.o.  Admit date: 01/09/2016 Admitting provider: Conard NovakStephen D Shonna Deiter, MD Discharge date: 01/09/2016   Admission Diagnoses:  1) intrauterine pregnancy at 4658w6d  2) dizzy and lightheaded  Discharge Diagnoses:  1) intrauterine pregnancy at 458w6d  2) dizzy and lightheaded  History of Present Illness: The patient is a 30 y.o. female [id number] at 7458w6d who presents for feeling dizzy and lightheaded early this morning when she got out of bed. She has been having her blood pressures monitored in the clinic recently for being "borderline."  She states that she was worried about her blood pressure and called EMS.  According to her EMS measured her BP at 160 systolic.  She denies headache, visual changes, and RUQ pain.  She notes +FM, no LOF, occasional braxton hicks ctx, no vaginal bleeding.  Normotensive with blood pressures on average 110s/60s-70s.  Patient discharged with precautions and follow up in 2 days in clinic already scheduled.   Past Medical History:  Diagnosis Date  . Anemia     Past Surgical History:  Procedure Laterality Date  . NO PAST SURGERIES      No current facility-administered medications on file prior to encounter.    Current Outpatient Prescriptions on File Prior to Encounter  Medication Sig Dispense Refill  . Prenatal Vit-Fe Fumarate-FA (PRENATAL MULTIVITAMIN) TABS tablet Take 1 tablet by mouth daily at 12 noon.     Allergies: No Known Allergies  Social History   Social History  . Marital status: Single    Spouse name: N/A  . Number of children: N/A  . Years of education: N/A   Occupational History  . Not on file.   Social History Main Topics  . Smoking status: Former Games developermoker  . Smokeless tobacco: Never Used  . Alcohol use No  . Drug use: No  . Sexual activity: Yes    Birth control/ protection: Condom   Other Topics Concern  . Not on file   Social  History Narrative  . No narrative on file    Physical Exam: BP 120/76   Pulse 99   Temp 98.2 F (36.8 C) (Oral)   Resp 18   Ht 5' (1.524 m)   Wt 160 lb (72.6 kg)   LMP 05/03/2015   BMI 31.25 kg/m   BPs 110s-120s/60s-70s Gen: NAD CV: RRR Pulm: CTAB Pelvic: deferred Ext: no e/c/t  Consults: None  Significant Findings/ Diagnostic Studies: None  Procedures: NST - category 1, reactive Baseline: 140 bpm Variability: moderate Accelerations: present Decelerations: absent Tocometry: no contractions   Discharge Condition: stable  Disposition: 01-Home or Self Care  Diet: Regular diet  Discharge Activity: Activity as tolerated     Medication List    STOP taking these medications   azithromycin 250 MG tablet Commonly known as:  ZITHROMAX Z-PAK   chlorpheniramine 4 MG tablet Commonly known as:  CHLOR-TRIMETON   guaiFENesin-codeine 100-10 MG/5ML syrup   promethazine 25 MG tablet Commonly known as:  PHENERGAN     TAKE these medications   prenatal multivitamin Tabs tablet Take 1 tablet by mouth daily at 12 noon.        Total time spent taking care of this patient: 20 minutes  Signed: Conard NovakJackson, Yoshiko Keleher D, MD  01/09/2016, 4:45 AM

## 2016-01-09 NOTE — OB Triage Note (Addendum)
Patient came in for observation for lower abdominal and back pain three days ago that got worse this morning around 0130. Patient reports uterine contractions that are not to painful. Patient rates pain 2 out of 10. Patient states she has been feeling the baby move fine. Patient denies leaking of fluid, vaginal bleeding and spotting. Vital signs stable and patient afebrile. FHR baseline 135 with moderate variability with accelerations 15 x 15 and no decelerations. Significant other at bedside. Will continue to monitor.

## 2016-01-09 NOTE — Discharge Summary (Signed)
Patient discharged with instructions on follow up appointment, labor precautions, and when to seek medical attention. Patient ambulatory at discharge with steady gait and no complaints. Patient verbalized understanding of discharge. Patient discharged with significant other.

## 2016-01-11 ENCOUNTER — Encounter: Payer: Self-pay | Admitting: *Deleted

## 2016-01-11 ENCOUNTER — Inpatient Hospital Stay
Admission: EM | Admit: 2016-01-11 | Discharge: 2016-01-14 | DRG: 775 | Disposition: A | Discharge status: Home / Self Care | Payer: Medicaid Other | Attending: Advanced Practice Midwife | Admitting: Advanced Practice Midwife

## 2016-01-11 DIAGNOSIS — Z9119 Patient's noncompliance with other medical treatment and regimen: Secondary | ICD-10-CM | POA: Diagnosis not present

## 2016-01-11 DIAGNOSIS — O139 Gestational [pregnancy-induced] hypertension without significant proteinuria, unspecified trimester: Secondary | ICD-10-CM | POA: Diagnosis present

## 2016-01-11 DIAGNOSIS — O9902 Anemia complicating childbirth: Secondary | ICD-10-CM | POA: Diagnosis not present

## 2016-01-11 DIAGNOSIS — D62 Acute posthemorrhagic anemia: Secondary | ICD-10-CM | POA: Diagnosis not present

## 2016-01-11 DIAGNOSIS — O26893 Other specified pregnancy related conditions, third trimester: Secondary | ICD-10-CM | POA: Diagnosis present

## 2016-01-11 DIAGNOSIS — O134 Gestational [pregnancy-induced] hypertension without significant proteinuria, complicating childbirth: Secondary | ICD-10-CM | POA: Diagnosis present

## 2016-01-11 DIAGNOSIS — Z3A37 37 weeks gestation of pregnancy: Secondary | ICD-10-CM

## 2016-01-11 LAB — URINE DRUG SCREEN, QUALITATIVE (ARMC ONLY)
AMPHETAMINES, UR SCREEN: NOT DETECTED
Barbiturates, Ur Screen: NOT DETECTED
Benzodiazepine, Ur Scrn: NOT DETECTED
Cannabinoid 50 Ng, Ur ~~LOC~~: NOT DETECTED
Cocaine Metabolite,Ur ~~LOC~~: NOT DETECTED
MDMA (ECSTASY) UR SCREEN: NOT DETECTED
Methadone Scn, Ur: NOT DETECTED
Opiate, Ur Screen: NOT DETECTED
PHENCYCLIDINE (PCP) UR S: NOT DETECTED
Tricyclic, Ur Screen: NOT DETECTED

## 2016-01-11 LAB — COMPREHENSIVE METABOLIC PANEL
ALBUMIN: 3.4 g/dL — AB (ref 3.5–5.0)
ALK PHOS: 122 U/L (ref 38–126)
ALT: 14 U/L (ref 14–54)
AST: 20 U/L (ref 15–41)
Anion gap: 7 (ref 5–15)
BILIRUBIN TOTAL: 0.5 mg/dL (ref 0.3–1.2)
CALCIUM: 8.6 mg/dL — AB (ref 8.9–10.3)
CO2: 22 mmol/L (ref 22–32)
CREATININE: 0.34 mg/dL — AB (ref 0.44–1.00)
Chloride: 107 mmol/L (ref 101–111)
GFR calc Af Amer: >60 mL/min (ref >60)
GFR calc non Af Amer: >60 mL/min (ref >60)
GLUCOSE: 95 mg/dL (ref 65–99)
Potassium: 3.4 mmol/L — ABNORMAL LOW (ref 3.5–5.1)
Sodium: 136 mmol/L (ref 135–145)
TOTAL PROTEIN: 6.7 g/dL (ref 6.5–8.1)

## 2016-01-11 LAB — CBC
HCT: 34.5 % — ABNORMAL LOW (ref 35.0–47.0)
Hemoglobin: 12.3 g/dL (ref 12.0–16.0)
MCH: 29.8 pg (ref 26.0–34.0)
MCHC: 35.7 g/dL (ref 32.0–36.0)
MCV: 83.6 fL (ref 80.0–100.0)
PLATELETS: 141 10*3/uL — AB (ref 150–440)
RBC: 4.12 MIL/uL (ref 3.80–5.20)
RDW: 13.5 % (ref 11.5–14.5)
WBC: 10.8 10*3/uL (ref 3.6–11.0)

## 2016-01-11 LAB — PROTEIN / CREATININE RATIO, URINE
CREATININE, URINE: 104 mg/dL
Protein Creatinine Ratio: 0.12 mg/mg{Cre} (ref 0.00–0.15)
Total Protein, Urine: 12 mg/dL

## 2016-01-11 LAB — CHLAMYDIA/NGC RT PCR (ARMC ONLY)
Chlamydia Tr: NOT DETECTED
N gonorrhoeae: NOT DETECTED

## 2016-01-11 LAB — RAPID HIV SCREEN (HIV 1/2 AB+AG)
HIV 1/2 ANTIBODIES: NONREACTIVE
HIV-1 P24 ANTIGEN - HIV24: NONREACTIVE

## 2016-01-11 LAB — TYPE AND SCREEN
ABO/RH(D): A NEG
Antibody Screen: NEGATIVE

## 2016-01-11 LAB — OB RESULTS CONSOLE HIV ANTIBODY (ROUTINE TESTING): HIV: NONREACTIVE

## 2016-01-11 MED ORDER — DINOPROSTONE 10 MG VA INST
10.0000 mg | VAGINAL_INSERT | Freq: Once | VAGINAL | Status: AC
  Administered 2016-01-11: 10 mg via VAGINAL
  Filled 2016-01-11: qty 1

## 2016-01-11 MED ORDER — OXYTOCIN BOLUS FROM INFUSION
500.0000 mL | Freq: Once | INTRAVENOUS | Status: AC
  Administered 2016-01-12: 500 mL via INTRAVENOUS

## 2016-01-11 MED ORDER — LIDOCAINE HCL (PF) 1 % IJ SOLN: 30.0000 mL | INTRAMUSCULAR | Status: DC | PRN

## 2016-01-11 MED ORDER — ONDANSETRON HCL 4 MG/2ML IJ SOLN: 4.0000 mg | Freq: Four times a day (QID) | INTRAMUSCULAR | Status: DC | PRN

## 2016-01-11 MED ORDER — LACTATED RINGERS IV SOLN: 500.0000 mL | INTRAVENOUS | Status: DC | PRN

## 2016-01-11 MED ORDER — LACTATED RINGERS IV SOLN
INTRAVENOUS | Status: DC
  Administered 2016-01-11 – 2016-01-12 (×2): via INTRAVENOUS
  Administered 2016-01-12: 125 mL/h via INTRAVENOUS

## 2016-01-11 MED ORDER — PENICILLIN G POTASSIUM 5000000 UNITS IJ SOLR
2.5000 10*6.[IU] | INTRAMUSCULAR | Status: DC
  Administered 2016-01-12 (×5): 2.5 10*6.[IU] via INTRAVENOUS
  Filled 2016-01-11 (×17): qty 2.5

## 2016-01-11 MED ORDER — AMMONIA AROMATIC IN INHA: 0.3000 mL | Freq: Once | RESPIRATORY_TRACT | Status: DC | PRN

## 2016-01-11 MED ORDER — OXYTOCIN 40 UNITS IN LACTATED RINGERS INFUSION - SIMPLE MED
2.5000 [IU]/h | INTRAVENOUS | Status: DC
  Administered 2016-01-12: 2.5 [IU]/h via INTRAVENOUS
  Filled 2016-01-11 (×2): qty 1000

## 2016-01-11 MED ORDER — PENICILLIN G POTASSIUM 5000000 UNITS IJ SOLR
5.0000 10*6.[IU] | Freq: Once | INTRAVENOUS | Status: AC
  Administered 2016-01-11: 5 10*6.[IU] via INTRAVENOUS
  Filled 2016-01-11: qty 5

## 2016-01-11 MED ORDER — MISOPROSTOL 200 MCG PO TABS
800.0000 ug | ORAL_TABLET | Freq: Once | ORAL | Status: AC | PRN
  Administered 2016-01-12: 800 ug via RECTAL

## 2016-01-11 NOTE — Progress Notes (Signed)
Urine obtained and sent for UDS, G/C, PCR.

## 2016-01-11 NOTE — H&P (Signed)
Date of Initial H&P: 01/11/2016  History reviewed, patient examined, no changes except as noted below:  30 year old G4 P2102 presents from office at 37 weeks presents from office  for IOL due to gestational hypertension and hx of an IUFD at 28 weeks. Blood pressures have been in the mild range.  Pregnancy has also been complicated by incarceration, concerns for placenta previa at 18 weeks which has resolved, threatened abortion in early pregnancy and being RH negative. She received Rhogam 2/22 for threatened AB. DId not get  Her 28 week Rhogam. Her care has been spotty and she has missed appts for antepartum testing and never had her 28 week labs.   O:  Patient Vitals for the past 24 hrs:  BP Temp Temp src Pulse Resp Height Weight  01/11/16 1819 121/75 - - 94 - - -  01/11/16 1816 (!) 108/92 - - 99 - - -  01/11/16 1800 119/87 - - (!) 101 - - -  01/11/16 1745 109/74 - - 98 - - -  01/11/16 1731 113/70 - - 93 - - -  01/11/16 1715 114/72 - - 95 - - -  01/11/16 1700 104/74 - - 96 - - -  01/11/16 1645 109/75 - - 92 - - -  01/11/16 1631 109/70 98.6 F (37 C) Oral (!) 102 - - -  01/11/16 1615 113/75 - - 94 - - -  01/11/16 1601 111/68 - - 93 - - -  01/11/16 1545 114/76 - - 97 - - -  01/11/16 1531 114/79 - - 91 - - -  01/11/16 1515 124/81 - - 97 - - -  01/11/16 1501 125/82 - - (!) 101 - - -  01/11/16 1123 114/72 98.3 F (36.8 C) Oral (!) 118 16 5' (1.524 m) 74.8 kg (165 lb)   Heart: RRR without murmur Lungs: CTA Ultrasound: cephalic Cervix: 2/13%/-0 to -3 FHR: 135 with accelerations to 160s to 170s, moderate variability Toco: irregular ctxs  Results for orders placed or performed during the hospital encounter of 01/11/16 (from the past 24 hour(s))  Rapid HIV screen (HIV 1/2 Ab+Ag) ([facility] Only)     Status: None   Collection Time: 01/11/16  3:04 PM  Result Value Ref Range   HIV-1 P24 Antigen - HIV24 NON REACTIVE NON REACTIVE   HIV 1/2 Antibodies NON REACTIVE NON REACTIVE   Interpretation  (HIV Ag Ab)      A non reactive test result means that HIV 1 or HIV 2 antibodies and HIV 1 p24 antigen were not detected in the specimen.  CBC     Status: Abnormal   Collection Time: 01/11/16  3:04 PM  Result Value Ref Range   WBC 10.8 3.6 - 11.0 K/uL   RBC 4.12 3.80 - 5.20 MIL/uL   Hemoglobin 12.3 12.0 - 16.0 g/dL   HCT 86.5 (L) 78.4 - 69.6 %   MCV 83.6 80.0 - 100.0 fL   MCH 29.8 26.0 - 34.0 pg   MCHC 35.7 32.0 - 36.0 g/dL   RDW 29.5 28.4 - 13.2 %   Platelets 141 (L) 150 - 440 K/uL  Type and screen [facility]     Status: None   Collection Time: 01/11/16  3:04 PM  Result Value Ref Range   ABO/RH(D) A NEG    Antibody Screen NEG    Sample Expiration 01/14/2016   Comprehensive metabolic panel     Status: Abnormal   Collection Time: 01/11/16  3:04 PM  Result Value Ref Range   Sodium 136 135 - 145 mmol/L   Potassium 3.4 (L) 3.5 - 5.1 mmol/L   Chloride 107 101 - 111 mmol/L   CO2 22 22 - 32 mmol/L   Glucose, Bld 95 65 - 99 mg/dL   BUN <5 (L) 6 - 20 mg/dL   Creatinine, Ser 1.610.34 (L) 0.44 - 1.00 mg/dL   Calcium 8.6 (L) 8.9 - 10.3 mg/dL   Total Protein 6.7 6.5 - 8.1 g/dL   Albumin 3.4 (L) 3.5 - 5.0 g/dL   AST 20 15 - 41 U/L   ALT 14 14 - 54 U/L   Alkaline Phosphatase 122 38 - 126 U/L   Total Bilirubin 0.5 0.3 - 1.2 mg/dL   GFR calc non Af Amer >60 >60 mL/min   GFR calc Af Amer >60 >60 mL/min   Anion gap 7 5 - 15  Urine Drug Screen, Qualitative ([facility] only)     Status: None   Collection Time: 01/11/16  3:12 PM  Result Value Ref Range   Tricyclic, Ur Screen NONE DETECTED NONE DETECTED   Amphetamines, Ur Screen NONE DETECTED NONE DETECTED   MDMA (Ecstasy)Ur Screen NONE DETECTED NONE DETECTED   Cocaine Metabolite,Ur [place] NONE DETECTED NONE DETECTED   Opiate, Ur Screen NONE DETECTED NONE DETECTED   Phencyclidine (PCP) Ur S NONE DETECTED NONE DETECTED   Cannabinoid 50 Ng, Ur [place] NONE DETECTED NONE DETECTED   Barbiturates, Ur Screen NONE DETECTED NONE DETECTED    Benzodiazepine, Ur Scrn NONE DETECTED NONE DETECTED   Methadone Scn, Ur NONE DETECTED NONE DETECTED  Chlamydia/NGC rt PCR ([facility] only)     Status: None   Collection Time: 01/11/16  3:12 PM  Result Value Ref Range   Specimen source GC/Chlam URINE, RANDOM    Chlamydia Tr NOT DETECTED NOT DETECTED   N gonorrhoeae NOT DETECTED NOT DETECTED  Protein / creatinine ratio, urine     Status: None   Collection Time: 01/11/16  3:12 PM  Result Value Ref Range   Creatinine, Urine 104 mg/dL   Total Protein, Urine 12 mg/dL   Protein Creatinine Ratio 0.12 0.00 - 0.15 mg/mg[Cre]   A: IUP at 37 weeks with gestational hypertension and noncompliance with care  Normotensive and normal labs currently  RH negative: did not get 28 week Rhogam   Antibody screen negative P: Plan induction with Cervidil GBS done here. PCN for GBS PPX Monitor BP and fetal well being Breast A negative/ RI/VI Desires BTL-will need to sign 30 day papers and have interval tubal  Gerardine Peltz, CNM

## 2016-01-12 ENCOUNTER — Inpatient Hospital Stay: Payer: Medicaid Other | Admitting: Anesthesiology

## 2016-01-12 ENCOUNTER — Encounter: Payer: Self-pay | Admitting: Anesthesiology

## 2016-01-12 LAB — CBC
HCT: 36.4 % (ref 35.0–47.0)
Hemoglobin: 12.6 g/dL (ref 12.0–16.0)
MCH: 29.4 pg (ref 26.0–34.0)
MCHC: 34.6 g/dL (ref 32.0–36.0)
MCV: 84.8 fL (ref 80.0–100.0)
PLATELETS: 131 10*3/uL — AB (ref 150–440)
RBC: 4.29 MIL/uL (ref 3.80–5.20)
RDW: 13.4 % (ref 11.5–14.5)
WBC: 13.6 10*3/uL — AB (ref 3.6–11.0)

## 2016-01-12 LAB — RPR: RPR: NONREACTIVE

## 2016-01-12 MED ORDER — LIDOCAINE HCL (PF) 1 % IJ SOLN
INTRAMUSCULAR | Status: DC | PRN
  Administered 2016-01-12 (×2): 1 mL via INTRADERMAL

## 2016-01-12 MED ORDER — MISOPROSTOL 200 MCG PO TABS
ORAL_TABLET | ORAL | Status: AC
  Administered 2016-01-12: 800 ug via RECTAL
  Filled 2016-01-12: qty 4

## 2016-01-12 MED ORDER — CARBOPROST TROMETHAMINE 250 MCG/ML IM SOLN
250.0000 ug | Freq: Once | INTRAMUSCULAR | Status: AC
  Administered 2016-01-13: 250 ug via INTRAMUSCULAR

## 2016-01-12 MED ORDER — LIDOCAINE HCL (PF) 1 % IJ SOLN
INTRAMUSCULAR | Status: AC
  Filled 2016-01-12: qty 30

## 2016-01-12 MED ORDER — OXYTOCIN 10 UNIT/ML IJ SOLN
INTRAMUSCULAR | Status: AC
  Filled 2016-01-12: qty 2

## 2016-01-12 MED ORDER — BUTORPHANOL TARTRATE 1 MG/ML IJ SOLN: 2.0000 mg | INTRAMUSCULAR | Status: DC | PRN

## 2016-01-12 MED ORDER — NALBUPHINE HCL 10 MG/ML IJ SOLN: 5.0000 mg | Freq: Once | INTRAMUSCULAR | Status: DC | PRN

## 2016-01-12 MED ORDER — OXYTOCIN 40 UNITS IN LACTATED RINGERS INFUSION - SIMPLE MED
1.0000 m[IU]/min | INTRAVENOUS | Status: DC
Start: 2016-01-12 — End: 2016-01-13
  Administered 2016-01-12: 1 m[IU]/min via INTRAVENOUS

## 2016-01-12 MED ORDER — MISOPROSTOL 200 MCG PO TABS
ORAL_TABLET | ORAL | Status: AC
  Filled 2016-01-12: qty 4

## 2016-01-12 MED ORDER — CARBOPROST TROMETHAMINE 250 MCG/ML IM SOLN
INTRAMUSCULAR | Status: AC
  Administered 2016-01-12: 250 ug via INTRAMUSCULAR
  Filled 2016-01-12: qty 1

## 2016-01-12 MED ORDER — SODIUM CHLORIDE 0.9 % IV SOLN
INTRAVENOUS | Status: DC | PRN
  Administered 2016-01-12 (×2): 5 mL via EPIDURAL

## 2016-01-12 MED ORDER — DIPHENHYDRAMINE HCL 50 MG/ML IJ SOLN: 12.5000 mg | INTRAMUSCULAR | Status: DC | PRN

## 2016-01-12 MED ORDER — CARBOPROST TROMETHAMINE 250 MCG/ML IM SOLN
250.0000 ug | Freq: Once | INTRAMUSCULAR | Status: AC
  Administered 2016-01-12: 250 ug via INTRAMUSCULAR

## 2016-01-12 MED ORDER — DIPHENHYDRAMINE HCL 25 MG PO CAPS: 25.0000 mg | ORAL_CAPSULE | ORAL | Status: DC | PRN

## 2016-01-12 MED ORDER — AMMONIA AROMATIC IN INHA
RESPIRATORY_TRACT | Status: AC
  Filled 2016-01-12: qty 10

## 2016-01-12 MED ORDER — NALOXONE HCL 2 MG/2ML IJ SOSY
1.0000 ug/kg/h | PREFILLED_SYRINGE | INTRAMUSCULAR | Status: DC | PRN
  Filled 2016-01-12: qty 2

## 2016-01-12 MED ORDER — FENTANYL 2.5 MCG/ML W/ROPIVACAINE 0.2% IN NS 100 ML EPIDURAL INFUSION (ARMC-ANES)
EPIDURAL | Status: DC | PRN
  Administered 2016-01-12: 9 mL/h via EPIDURAL

## 2016-01-12 MED ORDER — TERBUTALINE SULFATE 1 MG/ML IJ SOLN: 0.2500 mg | Freq: Once | INTRAMUSCULAR | Status: DC | PRN

## 2016-01-12 MED ORDER — SODIUM CHLORIDE 0.9% FLUSH: 3.0000 mL | INTRAVENOUS | Status: DC | PRN

## 2016-01-12 MED ORDER — FENTANYL 2.5 MCG/ML W/ROPIVACAINE 0.2% IN NS 100 ML EPIDURAL INFUSION (ARMC-ANES): 10.0000 mL/h | EPIDURAL | Status: DC

## 2016-01-12 MED ORDER — NALBUPHINE HCL 10 MG/ML IJ SOLN: 5.0000 mg | INTRAMUSCULAR | Status: DC | PRN

## 2016-01-12 MED ORDER — NALOXONE HCL 0.4 MG/ML IJ SOLN
0.4000 mg | INTRAMUSCULAR | Status: DC | PRN
  Filled 2016-01-12: qty 1

## 2016-01-12 MED ORDER — FENTANYL 2.5 MCG/ML W/ROPIVACAINE 0.2% IN NS 100 ML EPIDURAL INFUSION (ARMC-ANES)
EPIDURAL | Status: AC
  Filled 2016-01-12: qty 100

## 2016-01-12 MED ORDER — LIDOCAINE-EPINEPHRINE (PF) 1.5 %-1:200000 IJ SOLN
INTRAMUSCULAR | Status: DC | PRN
  Administered 2016-01-12: 3 mL via EPIDURAL

## 2016-01-12 NOTE — Progress Notes (Signed)
SVD of viable female infant over intact perineum.  Apgars 4/9.

## 2016-01-12 NOTE — Anesthesia Preprocedure Evaluation (Signed)
Anesthesia Evaluation  Patient identified by MRN, date of birth, ID band Patient awake    Reviewed: Allergy & Precautions, H&P , NPO status , Patient's Chart, lab work & pertinent test results  Airway Mallampati: III  TM Distance: >3 FB Neck ROM: full    Dental  (+) Chipped, Poor Dentition   Pulmonary neg pulmonary ROS,    Pulmonary exam normal breath sounds clear to auscultation       Cardiovascular Exercise Tolerance: Good hypertension, Normal cardiovascular exam Rhythm:regular Rate:Normal     Neuro/Psych    GI/Hepatic negative GI ROS,   Endo/Other    Renal/GU   negative genitourinary   Musculoskeletal   Abdominal   Peds  Hematology negative hematology ROS (+)   Anesthesia Other Findings Past Medical History: No date: Anemia No date: Pregnancy induced hypertension  Past Surgical History: No date: NO PAST SURGERIES  BMI    Body Mass Index:  32.22 kg/m      Reproductive/Obstetrics (+) Pregnancy                             Anesthesia Physical Anesthesia Plan  ASA: III  Anesthesia Plan: Epidural   Post-op Pain Management:    Induction:   Airway Management Planned:   Additional Equipment:   Intra-op Plan:   Post-operative Plan:   Informed Consent: I have reviewed the patients History and Physical, chart, labs and discussed the procedure including the risks, benefits and alternatives for the proposed anesthesia with the patient or authorized representative who has indicated his/her understanding and acceptance.     Plan Discussed with: Anesthesiologist  Anesthesia Plan Comments:         Anesthesia Quick Evaluation

## 2016-01-12 NOTE — Progress Notes (Signed)
L&D Note  01/12/2016 - 7:43 PM  29 y.o. [id number] 5823w1d   [name] is admitted for IOL for GHTN   Subjective:  Comfortable after epidural Objective:   Vitals:   01/12/16 1834 01/12/16 1839 01/12/16 1844 01/12/16 1920  BP: 127/75 126/77 119/70 115/63  Pulse: 92 (!) 103 (!) 116 (!) 110  Resp:    17  Temp:    98.4 F (36.9 C)  TempSrc:    Oral  Weight:      Height:        Current Vital Signs 24h Vital Sign Ranges  T 98.4 F (36.9 C) Temp  Avg: 98.1 F (36.7 C)  Min: 97.6 F (36.4 C)  Max: 98.5 F (36.9 C)  BP 115/63 BP  Min: 101/81  Max: 134/91  HR (!) 110 Pulse  Avg: 94.7  Min: 82  Max: 116  RR 17 Resp  Avg: 16.2  Min: 16  Max: 17  SaO2   Not Delivered No Data Recorded       24 Hour I/O Current Shift I/O  Time Ins Outs 09/25 0701 - 09/26 0700 In: 1011.7 [P.O.:120; I.V.:791.7] Out: -  No intake/output data recorded.    FHR: cat 1 tracing, 130 baseline, mod variability, + accels Toco: q 2-4 min SVE: 5/75/-1   Assessment :  IUP at 5823w1d, IOL for GHTN    Plan:  AROM with clear fluid obtained Anticipate vaginal delivery  Marta AntuBrothers, Odetta Forness, [place]

## 2016-01-12 NOTE — Progress Notes (Addendum)
[facility]. Multiple blood clots expressed from LUS. Fundus firm, urine expressed with initial exam so in & out catheter placed followed by indwelling catheter shortly after as urine continued to be expressed. Cytotec 800 mcg placed rectally and hemabate x 2 IM given. Dr Tiburcio PeaHarris paged and notified. CBC ordered stat. Will continue to monitor for bleeding. RN weighing pads. EBL from delivery 400 ml + 868 ml (weighed) = 1268 ml blood loss

## 2016-01-12 NOTE — Anesthesia Procedure Notes (Signed)
Epidural Patient location during procedure: OB Start time: 01/12/2016 6:23 PM End time: 01/12/2016 6:27 PM  Staffing Anesthesiologist: Rosaria FerriesPISCITELLO, Jakaylah Schlafer K Resident/CRNA: Mathews ArgyleLOGAN, BENJAMIN Performed: anesthesiologist   Preanesthetic Checklist Completed: patient identified, site marked, surgical consent, pre-op evaluation, timeout performed, IV checked, risks and benefits discussed and monitors and equipment checked  Epidural Patient position: sitting Prep: Betadine Patient monitoring: heart rate, continuous pulse ox and blood pressure Approach: midline Location: L4-L5 Injection technique: LOR saline  Needle:  Needle type: Tuohy  Needle gauge: 17 G Needle length: 9 cm and 9 Needle insertion depth: 5 cm Catheter type: closed end flexible Catheter size: 19 Gauge Catheter at skin depth: 10 cm Test dose: negative and 1.5% lidocaine with Epi 1:200 K  Assessment Sensory level: T10 Events: blood not aspirated, injection not painful, no injection resistance, negative IV test and no paresthesia  Additional Notes First 3 attempts by CRNA, 4th by MD Pt. Evaluated and documentation done after procedure finished. Patient identified. Risks/Benefits/Options discussed with patient including but not limited to bleeding, infection, nerve damage, paralysis, failed block, incomplete pain control, headache, blood pressure changes, nausea, vomiting, reactions to medication both or allergic, itching and postpartum back pain. Confirmed with bedside nurse the patient's most recent platelet count. Confirmed with patient that they are not currently taking any anticoagulation, have any bleeding history or any family history of bleeding disorders. Patient expressed understanding and wished to proceed. All questions were answered. Sterile technique was used throughout the entire procedure. Please see nursing notes for vital signs. Test dose was given through epidural catheter and negative prior to continuing to  dose epidural or start infusion. Warning signs of high block given to the patient including shortness of breath, tingling/numbness in hands, complete motor block, or any concerning symptoms with instructions to call for help. Patient was given instructions on fall risk and not to get out of bed. All questions and concerns addressed with instructions to call with any issues or inadequate analgesia.   Patient tolerated the insertion well without immediate complications.Reason for block:procedure for pain

## 2016-01-12 NOTE — Discharge Summary (Signed)
Obstetric Discharge Summary Reason for Admission: induction of labor - GHTN Delivery Type: spontaneous vaginal delivery Postpartum Procedures: none Complications-Intrapartum or Postpartum: none   Recent Labs  01/11/16 1504 01/12/16 1618  HGB 12.3 12.6  HCT 34.5* 36.4   BP (!) 105/56 (BP Location: Right Arm)   Pulse 79   Temp 98.3 F (36.8 C) (Oral)   Resp 18   Ht 5' (1.524 m)   Wt 165 lb (74.8 kg)   LMP 05/03/2015   SpO2 97%   Breastfeeding   BMI 32.22 kg/m    Gestational Age at Delivery: 6641w1d  Antepartum complications: irregular care, social hx, placenta previa (resolved), GHTN Date of Delivery: 01/12/16   Delivered By: Kerney ElbeKB, CNM   Physical Exam:  General: alert and cooperative Lochia: appropriate Uterine Fundus: firm Incision: N/A DVT Evaluation: No evidence of DVT seen on physical exam. Abdomen: abdomen is soft without significant tenderness, masses, organomegaly or guarding  Prenatal Labs Blood Type: A neg, Baby A+, Rhogam given postpartum Rubella: Immune Varicella: Immune TDAP: Up to date, Given postpartum and declined by patient Flu: Pt unsure if she received vaccine antepartum Feeding: breast Contraception: interval BTL signed consent  Discharge Diagnoses: Term Pregnancy-delivered  Discharge Information: Date: 01/14/2016 Activity: pelvic rest Diet: routine Medications: PNV Condition: stable Instructions:  Discharge instructions:   Call office if you have any of the following: headache, visual changes, fever >100 F, chills, breast concerns, excessive vaginal bleeding, incision drainage or problems, leg pain or redness, depression or any other concerns.   Activity: Do not lift > 10 lbs for 6 weeks.  No intercourse or tampons for 6 weeks.  No driving for 1-2 weeks.   Discharge to: home   Newborn Data: Live born female  Birth Weight: 7 lb 0.9 oz (3200 g) APGAR: 5, 9  Home with mother.  Marta AntuBrothers, Tamara, [place] 01/12/2016, 10:45 PM    Updated prior to discharge [facility] HospitalGLEDHILL,Kell Ferris, CNM

## 2016-01-13 LAB — CBC
HEMATOCRIT: 33 % — AB (ref 35.0–47.0)
HEMATOCRIT: 35 % (ref 35.0–47.0)
Hemoglobin: 11.9 g/dL — ABNORMAL LOW (ref 12.0–16.0)
Hemoglobin: 12.4 g/dL (ref 12.0–16.0)
MCH: 29.6 pg (ref 26.0–34.0)
MCH: 29.8 pg (ref 26.0–34.0)
MCHC: 35.3 g/dL (ref 32.0–36.0)
MCHC: 36.1 g/dL — AB (ref 32.0–36.0)
MCV: 82.7 fL (ref 80.0–100.0)
MCV: 83.9 fL (ref 80.0–100.0)
PLATELETS: 134 10*3/uL — AB (ref 150–440)
Platelets: 144 10*3/uL — ABNORMAL LOW (ref 150–440)
RBC: 3.99 MIL/uL (ref 3.80–5.20)
RBC: 4.17 MIL/uL (ref 3.80–5.20)
RDW: 13.2 % (ref 11.5–14.5)
RDW: 13.5 % (ref 11.5–14.5)
WBC: 18.6 10*3/uL — AB (ref 3.6–11.0)
WBC: 20.5 10*3/uL — ABNORMAL HIGH (ref 3.6–11.0)

## 2016-01-13 LAB — CULTURE, BETA STREP (GROUP B ONLY)

## 2016-01-13 LAB — FETAL SCREEN: FETAL SCREEN: NEGATIVE

## 2016-01-13 MED ORDER — DOCUSATE SODIUM 100 MG PO CAPS: 100.0000 mg | ORAL_CAPSULE | Freq: Two times a day (BID) | ORAL | Status: DC | PRN

## 2016-01-13 MED ORDER — DIBUCAINE 1 % RE OINT: 1.0000 "application " | TOPICAL_OINTMENT | RECTAL | Status: DC | PRN

## 2016-01-13 MED ORDER — DIPHENHYDRAMINE HCL 25 MG PO CAPS: 25.0000 mg | ORAL_CAPSULE | Freq: Four times a day (QID) | ORAL | Status: DC | PRN

## 2016-01-13 MED ORDER — PRENATAL MULTIVITAMIN CH
1.0000 | ORAL_TABLET | Freq: Every day | ORAL | Status: DC
  Administered 2016-01-13 – 2016-01-14 (×2): 1 via ORAL
  Filled 2016-01-13 (×2): qty 1

## 2016-01-13 MED ORDER — COCONUT OIL OIL: 1.0000 "application " | TOPICAL_OIL | Status: DC | PRN

## 2016-01-13 MED ORDER — ONDANSETRON HCL 4 MG PO TABS: 4.0000 mg | ORAL_TABLET | ORAL | Status: DC | PRN

## 2016-01-13 MED ORDER — ACETAMINOPHEN 325 MG PO TABS: 650.0000 mg | ORAL_TABLET | ORAL | Status: DC | PRN

## 2016-01-13 MED ORDER — ONDANSETRON HCL 4 MG/2ML IJ SOLN: 4.0000 mg | INTRAMUSCULAR | Status: DC | PRN

## 2016-01-13 MED ORDER — IBUPROFEN 600 MG PO TABS
600.0000 mg | ORAL_TABLET | Freq: Four times a day (QID) | ORAL | Status: DC
  Administered 2016-01-13 – 2016-01-14 (×6): 600 mg via ORAL
  Filled 2016-01-13 (×4): qty 1

## 2016-01-13 MED ORDER — WITCH HAZEL-GLYCERIN EX PADS: 1.0000 "application " | MEDICATED_PAD | CUTANEOUS | Status: DC | PRN

## 2016-01-13 MED ORDER — FERROUS SULFATE 325 (65 FE) MG PO TABS
325.0000 mg | ORAL_TABLET | Freq: Every day | ORAL | Status: DC
  Administered 2016-01-13 – 2016-01-14 (×2): 325 mg via ORAL
  Filled 2016-01-13 (×2): qty 1

## 2016-01-13 MED ORDER — LOPERAMIDE HCL 2 MG PO CAPS
2.0000 mg | ORAL_CAPSULE | Freq: Once | ORAL | Status: AC
  Administered 2016-01-13: 2 mg via ORAL
  Filled 2016-01-13: qty 1

## 2016-01-13 MED ORDER — OXYCODONE-ACETAMINOPHEN 5-325 MG PO TABS: 2.0000 | ORAL_TABLET | ORAL | Status: DC | PRN

## 2016-01-13 MED ORDER — LACTATED RINGERS IV SOLN: INTRAVENOUS | Status: DC

## 2016-01-13 MED ORDER — RHO D IMMUNE GLOBULIN 1500 UNIT/2ML IJ SOSY
300.0000 ug | PREFILLED_SYRINGE | Freq: Once | INTRAMUSCULAR | Status: AC
  Administered 2016-01-13: 300 ug via INTRAVENOUS
  Filled 2016-01-13: qty 2

## 2016-01-13 MED ORDER — ZOLPIDEM TARTRATE 5 MG PO TABS: 5.0000 mg | ORAL_TABLET | Freq: Every evening | ORAL | Status: DC | PRN

## 2016-01-13 MED ORDER — OXYCODONE-ACETAMINOPHEN 5-325 MG PO TABS
1.0000 | ORAL_TABLET | ORAL | Status: DC | PRN
  Administered 2016-01-13: 1 via ORAL
  Filled 2016-01-13: qty 1

## 2016-01-13 MED ORDER — BENZOCAINE-MENTHOL 20-0.5 % EX AERO: 1.0000 "application " | INHALATION_SPRAY | CUTANEOUS | Status: DC | PRN

## 2016-01-13 MED ORDER — IBUPROFEN 600 MG PO TABS
ORAL_TABLET | ORAL | Status: AC
  Administered 2016-01-13: 600 mg via ORAL
  Filled 2016-01-13: qty 1

## 2016-01-13 MED ORDER — SIMETHICONE 80 MG PO CHEW: 80.0000 mg | CHEWABLE_TABLET | ORAL | Status: DC | PRN

## 2016-01-13 NOTE — Progress Notes (Signed)
Subjective:  Doing well, no concerns.  Minimal lochia  Objective:   Vitals:   01/13/16 0050 01/13/16 0238 01/13/16 0441 01/13/16 0832  BP: (!) 133/95 136/86 132/84 113/68  Pulse: 100 85 94 75  Resp:  _0 Temp:  98.4 F (36.9 C) 98.7 F (37.1 C) 98.2 F (36.8 C)  TempSrc:  Oral Oral Oral  SpO2:  99% 99% 97%  Weight:      Height:         General: NAD Pulmonary: no increased work of breathing Abdomen: non-distended, non-tender, fundus firm at level of umbilicus Extremities: no edema, no erythema, no tenderness  Results for orders placed or performed during the hospital encounter of 01/11/16 (from the past 72 hour(s))  OB RESULTS CONSOLE HIV antibody     Status: None   Collection Time: 01/11/16 12:00 AM  Result Value Ref Range   HIV Non-reactive   Rapid HIV screen (HIV 1/2 Ab+Ag) ([facility] Only)     Status: None   Collection Time: 01/11/16  3:04 PM  Result Value Ref Range   HIV-1 P24 Antigen - HIV24 NON REACTIVE NON REACTIVE   HIV 1/2 Antibodies NON REACTIVE NON REACTIVE   Interpretation (HIV Ag Ab)      A non reactive test result means that HIV 1 or HIV 2 antibodies and HIV 1 p24 antigen were not detected in the specimen.  CBC     Status: Abnormal   Collection Time: 01/11/16  3:04 PM  Result Value Ref Range   WBC 10.8 3.6 - 11.0 K/uL   RBC 4.12 3.80 - 5.20 MIL/uL   Hemoglobin 12.3 12.0 - 16.0 g/dL    Comment: RESULT REPEATED AND VERIFIED   HCT 34.5 (L) 35.0 - 47.0 %   MCV 83.6 80.0 - 100.0 fL   MCH 29.8 26.0 - 34.0 pg   MCHC 35.7 32.0 - 36.0 g/dL   RDW 13.5 11.5 - 14.5 %   Platelets 141 (L) 150 - 440 K/uL  Type and screen [facility]     Status: None   Collection Time: 01/11/16  3:04 PM  Result Value Ref Range   ABO/RH(D) A NEG    Antibody Screen NEG    Sample Expiration 01/14/2016   RPR     Status: None   Collection Time: 01/11/16  3:04 PM  Result Value Ref Range   RPR Ser Ql Non Reactive Non Reactive    Comment: (NOTE) Performed  At: [facility] [place], [place] [id number] Lindon Romp MD [id number]   Comprehensive metabolic panel     Status: Abnormal   Collection Time: 01/11/16  3:04 PM  Result Value Ref Range   Sodium 136 135 - 145 mmol/L   Potassium 3.4 (L) 3.5 - 5.1 mmol/L   Chloride 107 101 - 111 mmol/L   CO2 22 22 - 32 mmol/L   Glucose, Bld 95 65 - 99 mg/dL   BUN <5 (L) 6 - 20 mg/dL   Creatinine, Ser 0.34 (L) 0.44 - 1.00 mg/dL   Calcium 8.6 (L) 8.9 - 10.3 mg/dL   Total Protein 6.7 6.5 - 8.1 g/dL   Albumin 3.4 (L) 3.5 - 5.0 g/dL   AST 20 15 - 41 U/L   ALT 14 14 - 54 U/L   Alkaline Phosphatase 122 38 - 126 U/L   Total Bilirubin 0.5 0.3 - 1.2 mg/dL   GFR calc non Af Amer >60 >60 mL/min   GFR calc  Af Amer >60 >60 mL/min    Comment: (NOTE) The eGFR has been calculated using the CKD EPI equation. This calculation has not been validated in all clinical situations. eGFR's persistently <60 mL/min signify possible Chronic Kidney Disease.    Anion gap 7 5 - 15  Culture, beta strep (group b only)     Status: None (Preliminary result)   Collection Time: 01/11/16  3:12 PM  Result Value Ref Range   Specimen Description VAGINAL/RECTAL    Special Requests NONE    Culture      CULTURE REINCUBATED FOR BETTER GROWTH Performed at [facility]    Report Status PENDING   Urine Drug Screen, Qualitative ([place] only)     Status: None   Collection Time: 01/11/16  3:12 PM  Result Value Ref Range   Tricyclic, Ur Screen NONE DETECTED NONE DETECTED   Amphetamines, Ur Screen NONE DETECTED NONE DETECTED   MDMA (Ecstasy)Ur Screen NONE DETECTED NONE DETECTED   Cocaine Metabolite,Ur [place] NONE DETECTED NONE DETECTED   Opiate, Ur Screen NONE DETECTED NONE DETECTED   Phencyclidine (PCP) Ur S NONE DETECTED NONE DETECTED   Cannabinoid 50 Ng, Ur [place] NONE DETECTED NONE DETECTED   Barbiturates, Ur Screen NONE DETECTED NONE DETECTED   Benzodiazepine, Ur Scrn NONE DETECTED NONE DETECTED    Methadone Scn, Ur NONE DETECTED NONE DETECTED    Comment: (NOTE) 622  Tricyclics, urine               Cutoff 1000 ng/mL 200  Amphetamines, urine             Cutoff 1000 ng/mL 300  MDMA (Ecstasy), urine           Cutoff 500 ng/mL 400  Cocaine Metabolite, urine       Cutoff 300 ng/mL 500  Opiate, urine                   Cutoff 300 ng/mL 600  Phencyclidine (PCP), urine      Cutoff 25 ng/mL 700  Cannabinoid, urine              Cutoff 50 ng/mL 800  Barbiturates, urine             Cutoff 200 ng/mL 900  Benzodiazepine, urine           Cutoff 200 ng/mL 1000 Methadone, urine                Cutoff 300 ng/mL 1100 1200 The urine drug screen provides only a preliminary, unconfirmed 1300 analytical test result and should not be used for non-medical 1400 purposes. Clinical consideration and professional judgment should 1500 be applied to any positive drug screen result due to possible 1600 interfering substances. A more specific alternate chemical method 1700 must be used in order to obtain a confirmed analytical result.  1800 Gas chromato graphy / mass spectrometry (GC/MS) is the preferred 1900 confirmatory method.   Chlamydia/NGC rt PCR ([place] only)     Status: None   Collection Time: 01/11/16  3:12 PM  Result Value Ref Range   Specimen source GC/Chlam URINE, RANDOM    Chlamydia Tr NOT DETECTED NOT DETECTED   N gonorrhoeae NOT DETECTED NOT DETECTED    Comment: (NOTE) 100  This methodology has not been evaluated in pregnant women or in 200  patients with a history of hysterectomy. 300 400  This methodology will not be performed on patients less than 85  years of age.   Protein /  creatinine ratio, urine     Status: None   Collection Time: 01/11/16  3:12 PM  Result Value Ref Range   Creatinine, Urine 104 mg/dL   Total Protein, Urine 12 mg/dL    Comment: NO NORMAL RANGE ESTABLISHED FOR THIS TEST   Protein Creatinine Ratio 0.12 0.00 - 0.15 mg/mg[Cre]  CBC     Status: Abnormal   Collection  Time: 01/12/16  4:18 PM  Result Value Ref Range   WBC 13.6 (H) 3.6 - 11.0 K/uL   RBC 4.29 3.80 - 5.20 MIL/uL   Hemoglobin 12.6 12.0 - 16.0 g/dL   HCT 36.4 35.0 - 47.0 %   MCV 84.8 80.0 - 100.0 fL   MCH 29.4 26.0 - 34.0 pg   MCHC 34.6 32.0 - 36.0 g/dL   RDW 13.4 11.5 - 14.5 %   Platelets 131 (L) 150 - 440 K/uL  CBC     Status: Abnormal   Collection Time: 01/13/16 12:13 AM  Result Value Ref Range   WBC 18.6 (H) 3.6 - 11.0 K/uL   RBC 4.17 3.80 - 5.20 MIL/uL   Hemoglobin 12.4 12.0 - 16.0 g/dL   HCT 35.0 35.0 - 47.0 %   MCV 83.9 80.0 - 100.0 fL   MCH 29.6 26.0 - 34.0 pg   MCHC 35.3 32.0 - 36.0 g/dL   RDW 13.5 11.5 - 14.5 %   Platelets 134 (L) 150 - 440 K/uL  Fetal screen     Status: None   Collection Time: 01/13/16  5:19 AM  Result Value Ref Range   Fetal Screen NEG   Rhogam injection     Status: None (Preliminary result)   Collection Time: 01/13/16  5:19 AM  Result Value Ref Range   Unit Number [id number]    Blood Component Type RHIG    Unit division 00    Status of Unit ALLOCATED    Transfusion Status OK TO TRANSFUSE   CBC     Status: Abnormal   Collection Time: 01/13/16  5:19 AM  Result Value Ref Range   WBC 20.5 (H) 3.6 - 11.0 K/uL   RBC 3.99 3.80 - 5.20 MIL/uL   Hemoglobin 11.9 (L) 12.0 - 16.0 g/dL   HCT 33.0 (L) 35.0 - 47.0 %   MCV 82.7 80.0 - 100.0 fL   MCH 29.8 26.0 - 34.0 pg   MCHC 36.1 (H) 32.0 - 36.0 g/dL   RDW 13.2 11.5 - 14.5 %   Platelets 144 (L) 150 - 440 K/uL   Information for the patient's newborn:  Laniya, Friedl [[id number]]  A POS   Assessment:   30 y.o. [id number] postpartum day # 1 TSVD, GHTN  Plan:    1) Acute blood loss anemia - hemodynamically stable and asymptomatic - po ferrous sulfate  2) --/--/A NEG (09/25 1504) / Rubella Immune (03/09 0000) / Varicella immune - infant A pos needs rhogam prior to discharge  3) Breast/BTL - interval needs scheduled for 6 weeks  4) GHTN - normotensive BP postpartum - 1 week BP check  5)  Disposition anticipate discharge PPD2

## 2016-01-13 NOTE — Anesthesia Postprocedure Evaluation (Signed)
Anesthesia Post Note  Patient: Stefanie Mason  Procedure(s) Performed: * No procedures listed *  Patient location during evaluation: Mother Baby Anesthesia Type: Epidural Level of consciousness: awake, awake and alert and oriented Pain management: pain level controlled Vital Signs Assessment: post-procedure vital signs reviewed and stable Respiratory status: spontaneous breathing, nonlabored ventilation and respiratory function stable Cardiovascular status: blood pressure returned to baseline and stable Postop Assessment: no headache and no backache Anesthetic complications: no    Last Vitals:  Vitals:   01/13/16 0238 01/13/16 0441  BP: 136/86 132/84  Pulse: 85 94  Resp: 18 18  Temp: 36.9 C 37.1 C    Last Pain:  Vitals:   01/13/16 0552  TempSrc:   PainSc: 3                  Masco CorporationStephanie Beyonka Pitney

## 2016-01-14 LAB — RHOGAM INJECTION: Unit division: 0

## 2016-01-14 NOTE — Discharge Instructions (Signed)
Discharge instructions:  Please call your doctor or return to the ER if you experience any chest pains, shortness of breath, fever greater than 101, any heavy bleeding or large clots, and foul smelling vaginal discharge, any worsening abdominal pain & cramping that is not controlled by pain medication, or any signs of post partum depression.  No tampons, enemas, douches, or sexual intercourse for 6 weeks.  Also avoid tub baths, hot tubs, or swimming for 6 weeks.  Call office if you have any of the following: headache, visual changes, fever >100 F, chills, breast concerns, excessive vaginal bleeding, incision drainage or problems, leg pain or redness, depression or any other concerns.   Activity: Do not lift > 10 lbs for 6 weeks.  No intercourse or tampons for 6 weeks.  No driving for 1-2 weeks.

## 2016-01-14 NOTE — Progress Notes (Signed)
D/C order from MD.  Reviewed d/c instructions and prescriptions with patient and answered any questions.  Patient d/c home with infant via wheelchair by nursing/auxillary. 

## 2016-02-18 ENCOUNTER — Inpatient Hospital Stay: Admission: RE | Admit: 2016-02-18 | Payer: Medicaid Other | Source: Ambulatory Visit

## 2016-02-25 ENCOUNTER — Encounter: Admission: RE | Payer: Self-pay | Source: Ambulatory Visit

## 2016-02-25 ENCOUNTER — Ambulatory Visit
Admission: RE | Admit: 2016-02-25 | Payer: Medicaid Other | Source: Ambulatory Visit | Admitting: Obstetrics and Gynecology

## 2016-02-25 SURGERY — LIGATION, FALLOPIAN TUBE, LAPAROSCOPIC
Anesthesia: Choice | Laterality: Bilateral

## 2016-03-25 ENCOUNTER — Other Ambulatory Visit: Payer: Medicaid Other

## 2016-03-31 ENCOUNTER — Emergency Department
Admission: EM | Admit: 2016-03-31 | Discharge: 2016-03-31 | Disposition: A | Discharge status: Home / Self Care | Payer: Medicaid Other | Attending: Emergency Medicine | Admitting: Emergency Medicine

## 2016-03-31 ENCOUNTER — Encounter: Payer: Self-pay | Admitting: *Deleted

## 2016-03-31 DIAGNOSIS — H9313 Tinnitus, bilateral: Secondary | ICD-10-CM | POA: Diagnosis present

## 2016-03-31 DIAGNOSIS — H6593 Unspecified nonsuppurative otitis media, bilateral: Secondary | ICD-10-CM | POA: Diagnosis not present

## 2016-03-31 MED ORDER — AMOXICILLIN 500 MG PO CAPS: 500.0000 mg | ORAL_CAPSULE | Freq: Three times a day (TID) | ORAL | 0 refills | Status: DC

## 2016-03-31 MED ORDER — FEXOFENADINE-PSEUDOEPHED ER 60-120 MG PO TB12: 1.0000 | ORAL_TABLET | Freq: Two times a day (BID) | ORAL | 0 refills | Status: DC

## 2016-03-31 NOTE — Discharge Instructions (Signed)
If ringing and hearing loss does not improve in one week follow-up with the ear nose and throat clinic.

## 2016-03-31 NOTE — ED Provider Notes (Signed)
[facility] Emergency Department Provider Note   ____________________________________________   First MD Initiated Contact with Patient 03/31/16 1423     (approximate)  I have reviewed the triage vital signs and the nursing notes.   HISTORY  Chief Complaint Otalgia    HPI Stefanie Mason is a 30 y.o. female patient complain of ringing and muscle sounds bilaterally as for 1 week. Patient states she's had a head cold for the past 2 weeks. Patient denies fever or vertical with this complaint. No palliative measures taken for this complaint. Patient denies ear pain. No palliative measures taken for this complaint.  Past Medical History:  Diagnosis Date  . Anemia   . Pregnancy induced hypertension     Patient Active Problem List   Diagnosis Date Noted  . Postpartum care following vaginal delivery 01/14/2016  . Gestational hypertension 01/11/2016  . Labor and delivery, indication for care 10/24/2015    Past Surgical History:  Procedure Laterality Date  . NO PAST SURGERIES      Prior to Admission medications   Medication Sig Start Date End Date Taking? Authorizing Provider  amoxicillin (AMOXIL) 500 MG capsule Take 1 capsule (500 mg total) by mouth 3 (three) times daily. 03/31/16   Joni Reiningonald K Ardys [place], PA-C  fexofenadine-pseudoephedrine (ALLEGRA-D) 60-120 MG 12 hr tablet Take 1 tablet by mouth 2 (two) times daily. 03/31/16   Joni Reiningonald K Sabirin Baray, PA-C  Prenatal Vit-Fe Fumarate-FA (PRENATAL MULTIVITAMIN) TABS tablet Take 1 tablet by mouth daily at 12 noon.    Historical Provider, MD    Allergies Patient has no known allergies.  Family History  Problem Relation Age of Onset  . Stroke Mother   . Diabetes Maternal Grandmother   . Heart disease Maternal Grandmother     Social History Social History  Substance Use Topics  . Smoking status: Never Smoker  . Smokeless tobacco: Never Used  . Alcohol use No    Review of Systems Constitutional: No  fever/chills Eyes: No visual changes. ENT: No sore throat.Decreased hearing and ringing in the bilateral ears Cardiovascular: Denies chest pain. Respiratory: Denies shortness of breath. Gastrointestinal: No abdominal pain.  No nausea, no vomiting.  No diarrhea.  No constipation. Genitourinary: Negative for dysuria. Musculoskeletal: Negative for back pain. Skin: Negative for rash. Neurological: Negative for headaches, focal weakness or numbness.    ____________________________________________   PHYSICAL EXAM:  VITAL SIGNS: ED Triage Vitals  Enc Vitals Group     BP 03/31/16 1357 133/79     Pulse Rate 03/31/16 1357 (!) 111     Resp 03/31/16 1357 16     Temp 03/31/16 1357 98.1 F (36.7 C)     Temp Source 03/31/16 1357 Oral     SpO2 03/31/16 1357 99 %     Weight 03/31/16 1359 140 lb (63.5 kg)     Height 03/31/16 1359 5' (1.524 m)     Head Circumference --      Peak Flow --      Pain Score --      Pain Loc --      Pain Edu? --      Excl. in GC? --     Constitutional: Alert and oriented. Well appearing and in no acute distress. Eyes: Conjunctivae are normal. PERRL. EOMI. Head: Atraumatic. Nose: No congestion/rhinnorhea. EARS: Bilateral erythematous edematous TMs.  Mouth/Throat: Mucous membranes are moist.  Oropharynx non-erythematous. Neck: No stridor.  No cervical spine tenderness to palpation. Hematological/Lymphatic/Immunilogical: No cervical lymphadenopathy. Cardiovascular: Normal rate, regular rhythm.  Grossly normal heart sounds.  Good peripheral circulation. Respiratory: Normal respiratory effort.  No retractions. Lungs CTAB. Gastrointestinal: Soft and nontender. No distention. No abdominal bruits. No CVA tenderness. Musculoskeletal: No lower extremity tenderness nor edema.  No joint effusions. Neurologic:  Normal speech and language. No gross focal neurologic deficits are appreciated. No gait instability. Skin:  Skin is warm, dry and intact. No rash  noted. Psychiatric: Mood and affect are normal. Speech and behavior are normal.  ____________________________________________   LABS (all labs ordered are listed, but only abnormal results are displayed)  Labs Reviewed - No data to display ____________________________________________  EKG   ____________________________________________  RADIOLOGY   ____________________________________________   PROCEDURES  Procedure(s) performed: None  Procedures  Critical Care performed: No  ____________________________________________   INITIAL IMPRESSION / ASSESSMENT AND PLAN / ED COURSE  Pertinent labs & imaging results that were available during my care of the patient were reviewed by me and considered in my medical decision making (see chart for details).  Otitis media and tinnitus. Patient given discharge care instructions. Patient given prescription for amoxicillin and fexofenadine with Sudafed. Patient advised follow-up with the ENT clinic if no improvement in one week. Return back to ER for condition worsens.  Clinical Course      ____________________________________________   FINAL CLINICAL IMPRESSION(S) / ED DIAGNOSES  Final diagnoses:  Bilateral non-suppurative otitis media  Tinnitus, bilateral      NEW MEDICATIONS STARTED DURING THIS VISIT:  New Prescriptions   AMOXICILLIN (AMOXIL) 500 MG CAPSULE    Take 1 capsule (500 mg total) by mouth 3 (three) times daily.   FEXOFENADINE-PSEUDOEPHEDRINE (ALLEGRA-D) 60-120 MG 12 HR TABLET    Take 1 tablet by mouth 2 (two) times daily.     Note:  This document was prepared using Dragon voice recognition software and may include unintentional dictation errors.    Pax Reasoner K Baptiste Littler, PA-C 12/14/Joni Reining17 1435    Jeanmarie PlantJames A McShane, MD 04/02/16 [phone]

## 2016-03-31 NOTE — ED Triage Notes (Signed)
Pt reports having a "head cold"for the past two weeks and reports ringing and muffled hearing for the past week. Pt reports ringing in ears bilaterally but denies ear pain at this time. Pt denies drainage from ears, fevers, NVD. Pt in NAD at this time.

## 2016-04-22 ENCOUNTER — Inpatient Hospital Stay: Admission: RE | Admit: 2016-04-22 | Payer: Medicaid Other | Source: Ambulatory Visit

## 2016-04-25 ENCOUNTER — Encounter
Admission: RE | Admit: 2016-04-25 | Discharge: 2016-04-25 | Disposition: A | Discharge status: Home / Self Care | Payer: Medicaid Other | Source: Ambulatory Visit | Attending: Obstetrics and Gynecology | Admitting: Obstetrics and Gynecology

## 2016-04-25 ENCOUNTER — Encounter: Payer: Self-pay | Admitting: *Deleted

## 2016-04-25 HISTORY — DX: Cardiac murmur, unspecified: R01.1

## 2016-04-25 NOTE — Patient Instructions (Signed)
  Your procedure is scheduled on: 04-29-16 Report to Same Day Surgery 2nd floor medical mall (Medical Mall Entrance-take elevator on left to 2nd floor.  Check in with surgery information desk.) To find out your arrival time please call [phone] between 1PM - 3PM on 04-28-16  Remember: Instructions that are not followed completely may result in serious medical risk, up to and including death, or upon the discretion of your surgeon and anesthesiologist your surgery may need to be rescheduled.    _x___ 1. Do not eat food or drink liquids after midnight. No gum chewing or hard candies.     __x__ 2. No Alcohol for 24 hours before or after surgery.   __x__3. No Smoking for 24 prior to surgery.   ____  4. Bring all medications with you on the day of surgery if instructed.    __x__ 5. Notify your doctor if there is any change in your medical condition     (cold, fever, infections).     Do not wear jewelry, make-up, hairpins, clips or nail polish.  Do not wear lotions, powders, or perfumes. You may wear deodorant.  Do not shave 48 hours prior to surgery. Men may shave face and neck.  Do not bring valuables to the hospital.    [facility] is not responsible for any belongings or valuables.               Contacts, dentures or bridgework may not be worn into surgery.  Leave your suitcase in the car. After surgery it may be brought to your room.  For patients admitted to the hospital, discharge time is determined by your treatment team.   Patients discharged the day of surgery will not be allowed to drive home.  You will need someone to drive you home and stay with you the night of your procedure.    Please read over the following fact sheets that you were given:   [facility] and or MRSA Information   ____ Take these medicines the morning of surgery with A SIP OF WATER:    1. NONE  2.  3.  4.  5.  6.  ____Fleets enema or Magnesium Citrate as directed.   ____  Use CHG Soap or sage wipes as directed on instruction sheet   ____ Use inhalers on the day of surgery and bring to hospital day of surgery  ____ Stop metformin 2 days prior to surgery    ____ Take 1/2 of usual insulin dose the night before surgery and none on the morning of  surgery.   ____ Stop Aspirin, Coumadin, Pllavix ,Eliquis, Effient, or Pradaxa  x__ Stop Anti-inflammatories such as Advil, Aleve, Ibuprofen, Motrin, Naproxen,          Naprosyn, Goodies powders or aspirin products NOW-Ok to take Tylenol.   ____ Stop supplements until after surgery.    ____ Bring C-Pap to the hospital.    

## 2016-04-26 ENCOUNTER — Encounter: Payer: Self-pay | Admitting: *Deleted

## 2016-04-26 ENCOUNTER — Emergency Department
Admission: EM | Admit: 2016-04-26 | Discharge: 2016-04-26 | Disposition: A | Discharge status: Home / Self Care | Payer: Medicaid Other | Attending: Emergency Medicine | Admitting: Emergency Medicine

## 2016-04-26 DIAGNOSIS — H6121 Impacted cerumen, right ear: Secondary | ICD-10-CM | POA: Diagnosis not present

## 2016-04-26 DIAGNOSIS — H9201 Otalgia, right ear: Secondary | ICD-10-CM | POA: Diagnosis present

## 2016-04-26 DIAGNOSIS — H9313 Tinnitus, bilateral: Secondary | ICD-10-CM | POA: Diagnosis not present

## 2016-04-26 DIAGNOSIS — Z87891 Personal history of nicotine dependence: Secondary | ICD-10-CM | POA: Insufficient documentation

## 2016-04-26 NOTE — ED Provider Notes (Signed)
[facility] Emergency Department Provider Note   ____________________________________________   First MD Initiated Contact with Patient 04/26/16 1440     (approximate)  I have reviewed the triage vital signs and the nursing notes.   HISTORY  Chief Complaint Otalgia    HPI Stefanie Mason is a 31 y.o. female patient complaining of continual left ear pain for several months. Patient was seen one month ago this ED and diagnosed with otitis media and tinnitus. Patient stated no relief taking amoxicillin and Allegra-D. Patient state the ringing has increase in her hearing loss has increased. Patient was advised to follow with ENT clinic but did not follow up as directed. Patient denies pain with this complaint.   Past Medical History:  Diagnosis Date  . Anemia    H/O  . Heart murmur    ASYMPTOMATIC  . Pregnancy induced hypertension     Patient Active Problem List   Diagnosis Date Noted  . Postpartum care following vaginal delivery 01/14/2016  . Gestational hypertension 01/11/2016  . Labor and delivery, indication for care 10/24/2015    Past Surgical History:  Procedure Laterality Date  . NO PAST SURGERIES      Prior to Admission medications   Not on File    Allergies Patient has no known allergies.  Family History  Problem Relation Age of Onset  . Stroke Mother   . Diabetes Maternal Grandmother   . Heart disease Maternal Grandmother     Social History Social History  Substance Use Topics  . Smoking status: Former Smoker    Packs/day: 0.25    Years: 4.00    Types: Cigarettes    Quit date: 04/25/2010  . Smokeless tobacco: Never Used  . Alcohol use Yes     Comment: OCC    Review of Systems Constitutional: No fever/chills Eyes: No visual changes. ENT: No sore throat. Ringing in right ear and bilateral hand loss. Cardiovascular: Denies chest pain. Respiratory: Denies shortness of breath. Gastrointestinal: No abdominal pain.   No nausea, no vomiting.  No diarrhea.  No constipation. Genitourinary: Negative for dysuria. Musculoskeletal: Negative for back pain. Skin: Negative for rash. Neurological: Negative for headaches, focal weakness or numbness.    ____________________________________________   PHYSICAL EXAM:  VITAL SIGNS: ED Triage Vitals [04/26/16 1336]  Enc Vitals Group     BP 112/63     Pulse Rate 86     Resp 18     Temp 98.2 F (36.8 C)     Temp Source Oral     SpO2 100 %     Weight 140 lb (63.5 kg)     Height 5' (1.524 m)     Head Circumference      Peak Flow      Pain Score 5     Pain Loc      Pain Edu?      Excl. in GC?     Constitutional: Alert and oriented. Well appearing and in no acute distress. Eyes: Conjunctivae are normal. PERRL. EOMI. Head: Atraumatic. Nose: No congestion/rhinnorhea. EARS: Right TM not visible secondary cerebral impaction. TM reveals scar tissue. Patient passed whisper test  Mouth/Throat: Mucous membranes are moist.  Oropharynx non-erythematous. Neck: No stridor.  No cervical spine tenderness to palpation. Hematological/Lymphatic/Immunilogical: No cervical lymphadenopathy. Cardiovascular: Normal rate, regular rhythm. Grossly normal heart sounds.  Good peripheral circulation. Respiratory: Normal respiratory effort.  No retractions. Lungs CTAB. Gastrointestinal: Soft and nontender. No distention. No abdominal bruits. No CVA tenderness. Musculoskeletal: No lower  extremity tenderness nor edema.  No joint effusions. Neurologic:  Normal speech and language. No gross focal neurologic deficits are appreciated. No gait instability. Skin:  Skin is warm, dry and intact. No rash noted. Psychiatric: Mood and affect are normal. Speech and behavior are normal.  ____________________________________________   LABS (all labs ordered are listed, but only abnormal results are displayed)  Labs Reviewed - No data to  display ____________________________________________  EKG   ____________________________________________  RADIOLOGY   ____________________________________________   PROCEDURES  Procedure(s) performed: None  Procedures  Critical Care performed: No  ____________________________________________   INITIAL IMPRESSION / ASSESSMENT AND PLAN / ED COURSE  Pertinent labs & imaging results that were available during my care of the patient were reviewed by me and considered in my medical decision making (see chart for details).  Right cerumen impaction. Tinnitus. Patient given discharge care instructions. Patient advised to follow-up with ENT clinic  Clinical Course    Patient presents with hearing loss and ringing in bilateral ears. Right ear reveals cerumen impaction. Right ear was irrigated to remove impaction. Right TM was unremarkable.  ____________________________________________   FINAL CLINICAL IMPRESSION(S) / ED DIAGNOSES  Final diagnoses:  Impacted cerumen of right ear  Tinnitus aurium, bilateral      NEW MEDICATIONS STARTED DURING THIS VISIT:  New Prescriptions   No medications on file     Note:  This document was prepared using Dragon voice recognition software and may include unintentional dictation errors.    Joni Reiningonald K Lakeithia Rasor, PA-C 04/26/16 1452    Joni Reiningonald K Izayah Miner, PA-C 04/26/16 1454    Charlynne Panderavid Hsienta Yao, MD 04/26/16 [phone]

## 2016-04-26 NOTE — ED Notes (Addendum)
Pt states she was seen here xfew months for ear pain and hearing loss, states has increased,.

## 2016-04-26 NOTE — ED Triage Notes (Signed)
Pt states left ear pain for several months

## 2016-04-27 IMAGING — US US OB COMP LESS 14 WK
1 series · 13 of 28 positions shown · non-contrast
Comparison: Pelvic ultrasound performed 10/08/2010

CLINICAL DATA: Acute onset of vaginal bleeding and pelvic cramping.
Initial encounter.

EXAM:
OBSTETRIC <14 WK US AND TRANSVAGINAL OB US
TECHNIQUE: Both transabdominal and transvaginal ultrasound examinations were
performed for complete evaluation of the gestation as well as the
maternal uterus, adnexal regions, and pelvic cul-de-sac.
Transvaginal technique was performed to assess early pregnancy.

[Series 1: us ob comp less 14 wk · 0.20mm/px · 122 acquisitions, 13 frames shown]
[im 5/122]
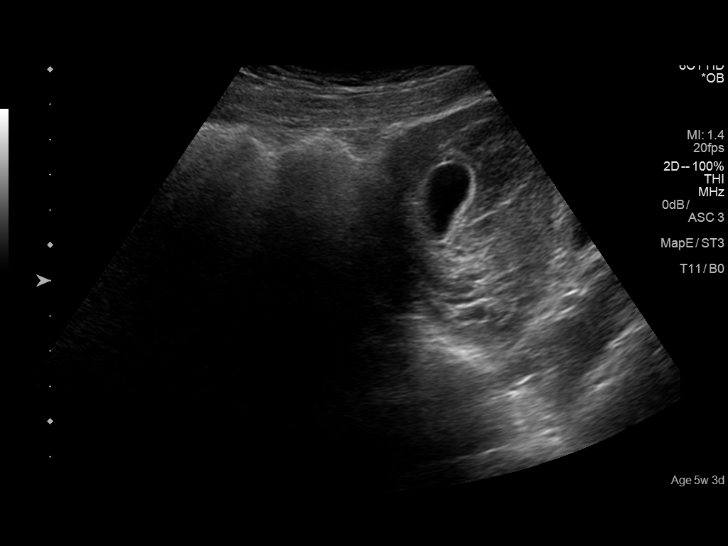
[im 14/122]
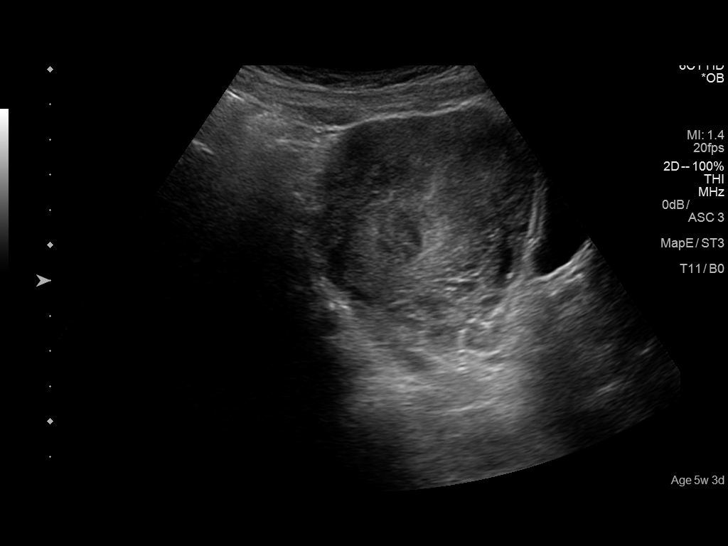
[im 23/122]
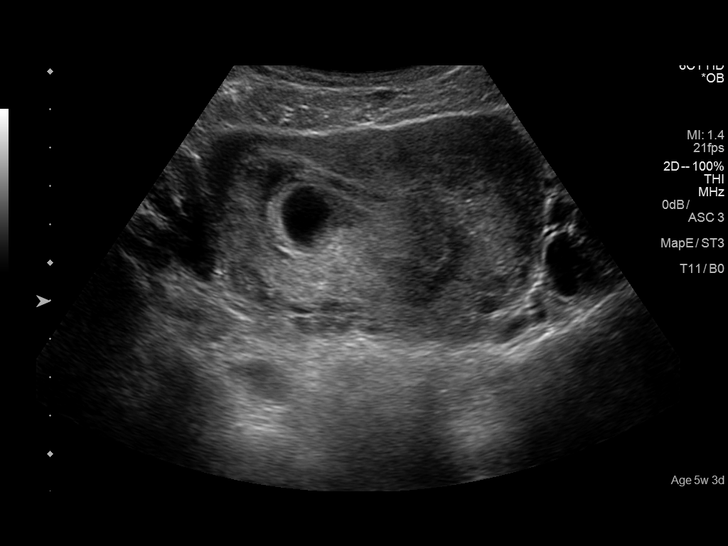
[im 32/122]
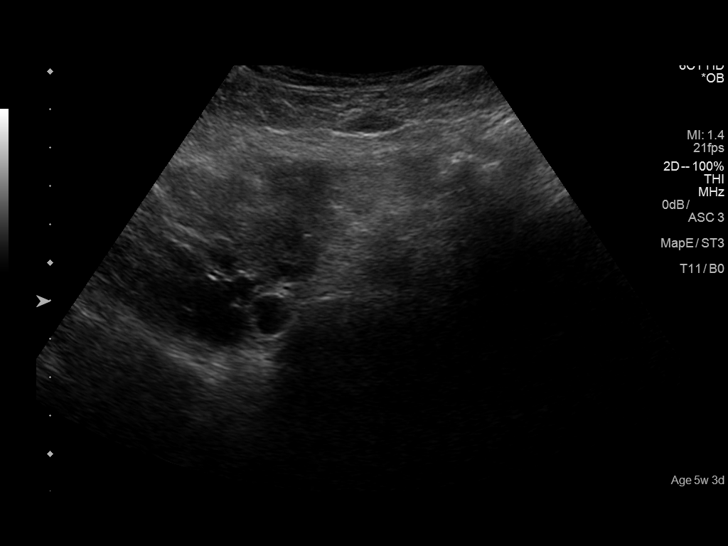
[im 41/122]
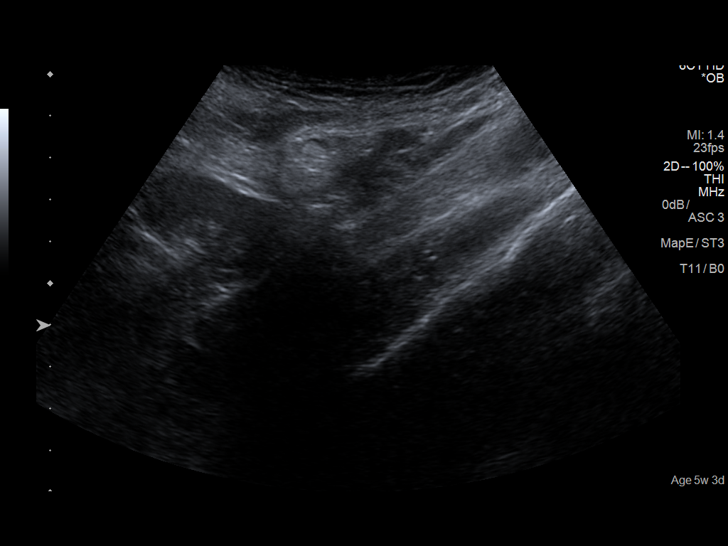
[im 50/122]
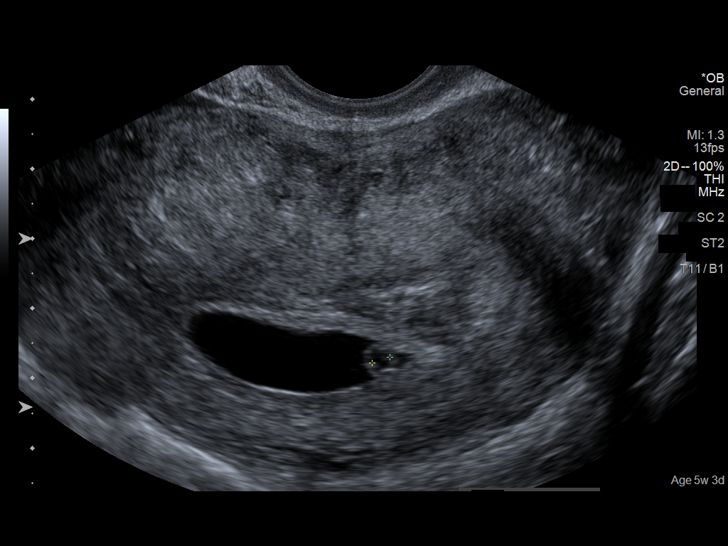
[im 63/122]
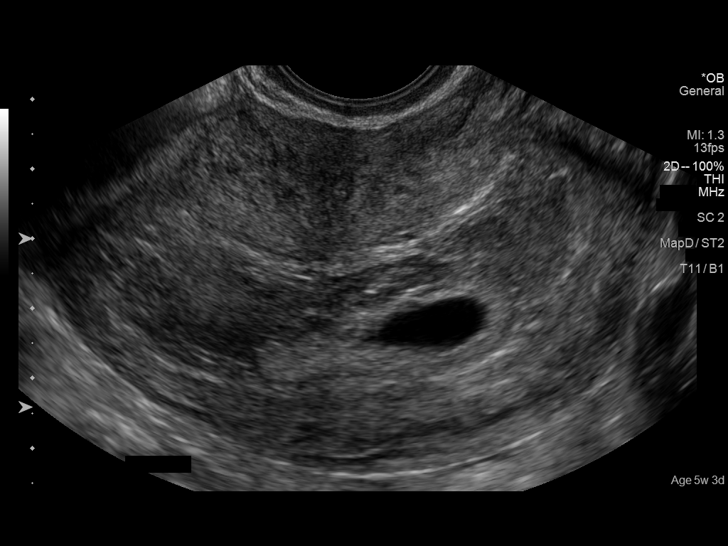
[im 72/122]
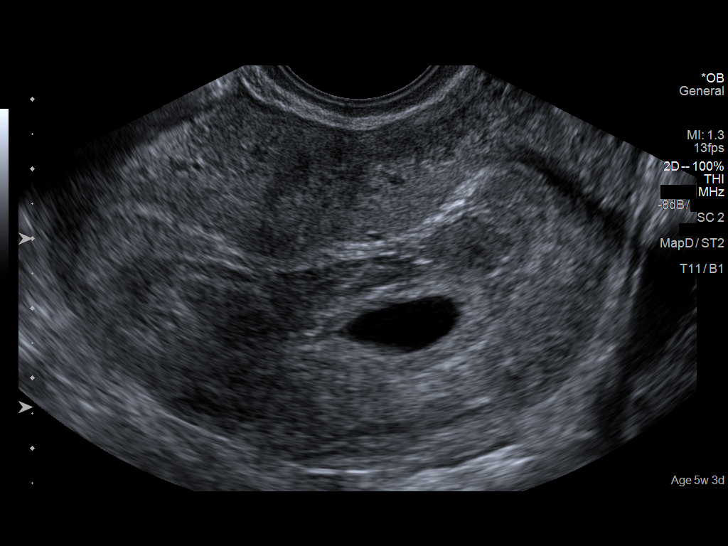
[im 81/122]
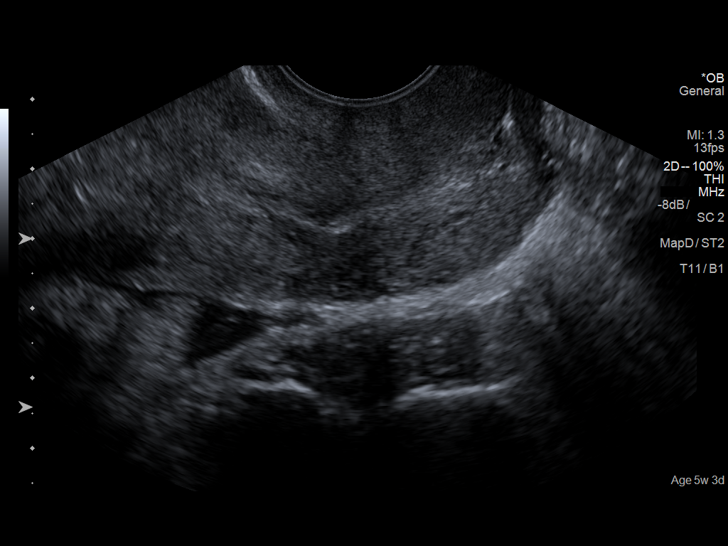
[im 90/122]
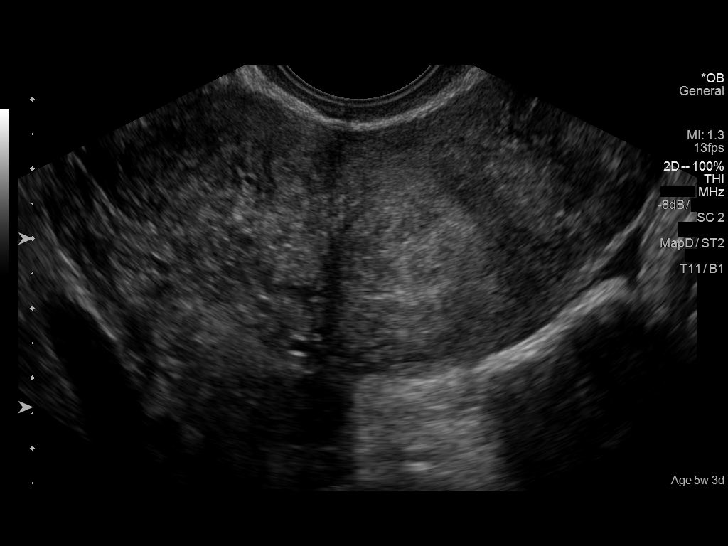
[im 99/122]
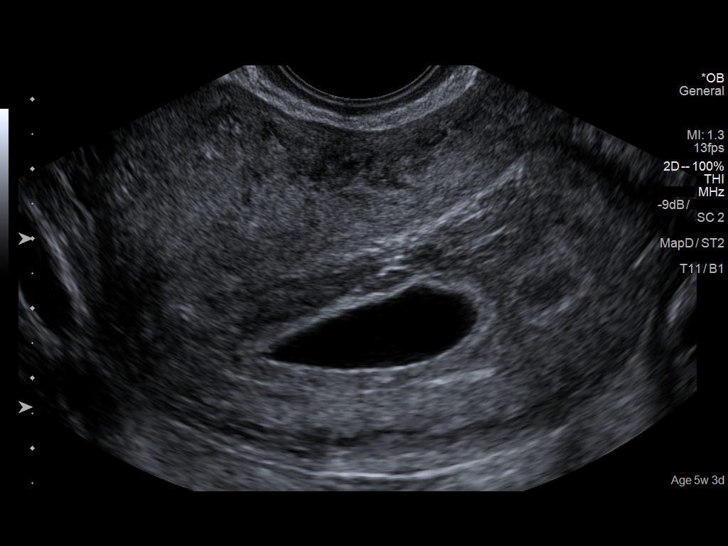
[im 108/122]
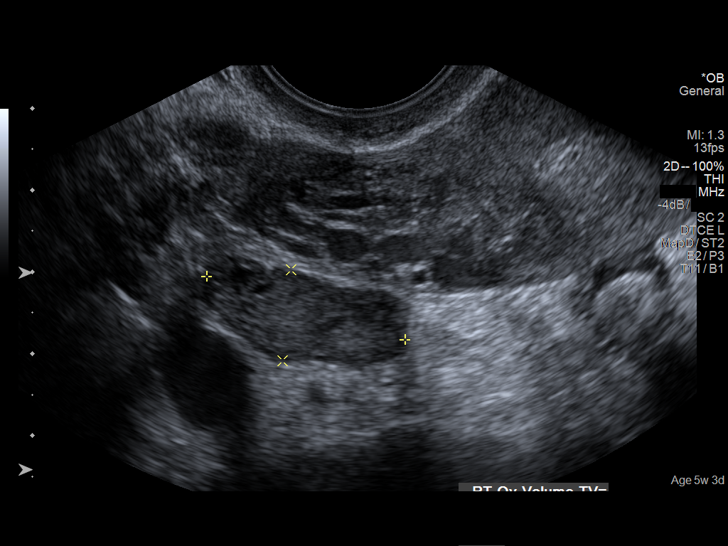
[im 117/122]
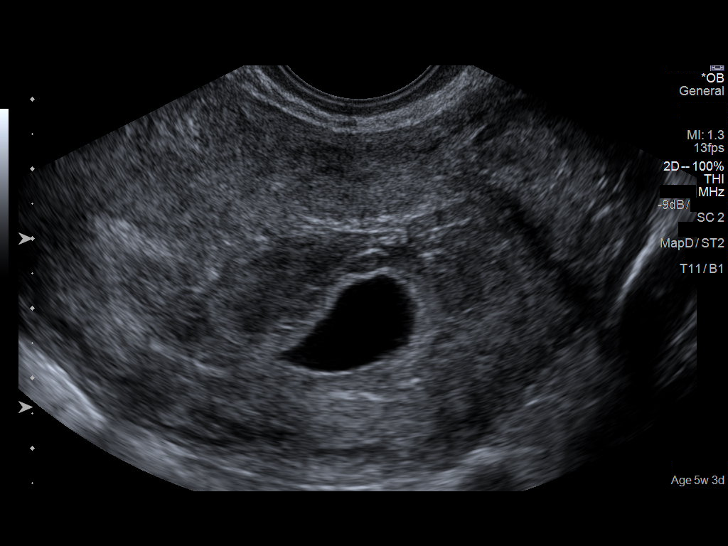

[13 of 28 positions shown; findings below may reference images not displayed]

FINDINGS: Intrauterine gestational sac: Visualized/normal in shape.

Yolk sac:  Yes

Embryo:  Yes

Cardiac Activity: Yes

Heart Rate: 118  bpm

CRL:  5.1  mm   6 w   2 d                  US EDC: 02/01/2016

Subchorionic hemorrhage: A moderate to large amount of subchorionic
hemorrhage is noted, measuring 6.3 x 1.2 x 6.0 cm.

Maternal uterus/adnexae: The right ovary is unremarkable in
appearance, though difficult to fully characterize. The left ovary
is not seen.

No free fluid is seen within the pelvic cul-de-sac.
IMPRESSION: 1. Single live intrauterine pregnancy noted, with a crown-rump
length of 5 mm, corresponding to a gestational age of 6 weeks 2
days. This matches the gestational age of 5 weeks 3 days by LMP,
reflecting an estimated date of delivery February 07, 2016.
2. Moderate to large amount of subchorionic hemorrhage noted, likely
corresponding to the patient's vaginal bleeding.

## 2016-04-29 ENCOUNTER — Encounter: Admission: RE | Payer: Self-pay | Source: Ambulatory Visit

## 2016-04-29 ENCOUNTER — Ambulatory Visit
Admission: RE | Admit: 2016-04-29 | Payer: Medicaid Other | Source: Ambulatory Visit | Admitting: Obstetrics and Gynecology

## 2016-04-29 ENCOUNTER — Ambulatory Visit
Admission: RE | Admit: 2016-04-29 | Discharge: 2016-04-29 | Disposition: A | Discharge status: Home / Self Care | Payer: Medicaid Other | Source: Ambulatory Visit | Attending: Obstetrics and Gynecology | Admitting: Obstetrics and Gynecology

## 2016-04-29 ENCOUNTER — Encounter: Payer: Self-pay | Admitting: *Deleted

## 2016-04-29 ENCOUNTER — Ambulatory Visit: Payer: Medicaid Other | Admitting: Anesthesiology

## 2016-04-29 ENCOUNTER — Encounter
Admission: RE | Disposition: A | Discharge status: Home / Self Care | Payer: Self-pay | Source: Ambulatory Visit | Attending: Obstetrics and Gynecology

## 2016-04-29 DIAGNOSIS — Z87891 Personal history of nicotine dependence: Secondary | ICD-10-CM | POA: Diagnosis not present

## 2016-04-29 DIAGNOSIS — R011 Cardiac murmur, unspecified: Secondary | ICD-10-CM | POA: Insufficient documentation

## 2016-04-29 DIAGNOSIS — Z302 Encounter for sterilization: Secondary | ICD-10-CM | POA: Diagnosis present

## 2016-04-29 HISTORY — PX: LAPAROSCOPIC TUBAL LIGATION: SHX1937

## 2016-04-29 LAB — POCT PREGNANCY, URINE: PREG TEST UR: NEGATIVE

## 2016-04-29 SURGERY — LIGATION, FALLOPIAN TUBE, LAPAROSCOPIC
Anesthesia: General | Laterality: Bilateral

## 2016-04-29 SURGERY — LIGATION, FALLOPIAN TUBE, LAPAROSCOPIC
Anesthesia: Choice | Laterality: Bilateral

## 2016-04-29 MED ORDER — DEXAMETHASONE SODIUM PHOSPHATE 10 MG/ML IJ SOLN: INTRAMUSCULAR | Status: DC | PRN

## 2016-04-29 MED ORDER — OXYCODONE HCL 5 MG PO TABS
ORAL_TABLET | ORAL | Status: AC
  Filled 2016-04-29: qty 1

## 2016-04-29 MED ORDER — LIDOCAINE HCL (CARDIAC) 20 MG/ML IV SOLN
INTRAVENOUS | Status: DC | PRN
  Administered 2016-04-29: 100 mg via INTRAVENOUS

## 2016-04-29 MED ORDER — ONDANSETRON HCL 4 MG/2ML IJ SOLN
INTRAMUSCULAR | Status: DC | PRN
  Administered 2016-04-29: 4 mg via INTRAVENOUS

## 2016-04-29 MED ORDER — IBUPROFEN 600 MG PO TABS: 600.0000 mg | ORAL_TABLET | Freq: Four times a day (QID) | ORAL | 3 refills | Status: DC | PRN

## 2016-04-29 MED ORDER — FENTANYL CITRATE (PF) 100 MCG/2ML IJ SOLN
INTRAMUSCULAR | Status: AC
  Filled 2016-04-29: qty 2

## 2016-04-29 MED ORDER — OXYCODONE HCL 5 MG PO TABS
5.0000 mg | ORAL_TABLET | Freq: Once | ORAL | Status: AC | PRN
  Administered 2016-04-29: 5 mg via ORAL

## 2016-04-29 MED ORDER — PROPOFOL 10 MG/ML IV BOLUS
INTRAVENOUS | Status: DC | PRN
  Administered 2016-04-29: 150 mg via INTRAVENOUS

## 2016-04-29 MED ORDER — DEXAMETHASONE SODIUM PHOSPHATE 10 MG/ML IJ SOLN
INTRAMUSCULAR | Status: DC | PRN
  Administered 2016-04-29: 8 mg via INTRAVENOUS

## 2016-04-29 MED ORDER — FENTANYL CITRATE (PF) 100 MCG/2ML IJ SOLN
INTRAMUSCULAR | Status: DC | PRN
Start: 2016-04-29 — End: 2016-04-29
  Administered 2016-04-29: 100 ug via INTRAVENOUS

## 2016-04-29 MED ORDER — LACTATED RINGERS IV SOLN
INTRAVENOUS | Status: DC
  Administered 2016-04-29 (×2): via INTRAVENOUS

## 2016-04-29 MED ORDER — SUCCINYLCHOLINE CHLORIDE 20 MG/ML IJ SOLN
INTRAMUSCULAR | Status: DC | PRN
  Administered 2016-04-29: 100 mg via INTRAVENOUS

## 2016-04-29 MED ORDER — FAMOTIDINE 20 MG PO TABS
20.0000 mg | ORAL_TABLET | Freq: Once | ORAL | Status: AC
  Administered 2016-04-29: 20 mg via ORAL

## 2016-04-29 MED ORDER — BUPIVACAINE HCL (PF) 0.5 % IJ SOLN
INTRAMUSCULAR | Status: AC
  Filled 2016-04-29: qty 30

## 2016-04-29 MED ORDER — BUPIVACAINE HCL 0.5 % IJ SOLN
INTRAMUSCULAR | Status: DC | PRN
  Administered 2016-04-29: 10 mL

## 2016-04-29 MED ORDER — OXYCODONE HCL 5 MG/5ML PO SOLN: 5.0000 mg | Freq: Once | ORAL | Status: AC | PRN

## 2016-04-29 MED ORDER — FENTANYL CITRATE (PF) 100 MCG/2ML IJ SOLN
25.0000 ug | INTRAMUSCULAR | Status: DC | PRN
  Administered 2016-04-29 (×3): 50 ug via INTRAVENOUS

## 2016-04-29 MED ORDER — ROCURONIUM BROMIDE 100 MG/10ML IV SOLN
INTRAVENOUS | Status: DC | PRN
  Administered 2016-04-29: 15 mg via INTRAVENOUS
  Administered 2016-04-29: 5 mg via INTRAVENOUS

## 2016-04-29 MED ORDER — SUGAMMADEX SODIUM 500 MG/5ML IV SOLN
INTRAVENOUS | Status: DC | PRN
  Administered 2016-04-29: 127 mg via INTRAVENOUS

## 2016-04-29 MED ORDER — FAMOTIDINE 20 MG PO TABS
ORAL_TABLET | ORAL | Status: AC
  Administered 2016-04-29: 20 mg via ORAL
  Filled 2016-04-29: qty 1

## 2016-04-29 MED ORDER — MIDAZOLAM HCL 2 MG/2ML IJ SOLN
INTRAMUSCULAR | Status: DC | PRN
  Administered 2016-04-29: 2 mg via INTRAVENOUS

## 2016-04-29 MED ORDER — OXYCODONE-ACETAMINOPHEN 5-325 MG PO TABS: 1.0000 | ORAL_TABLET | Freq: Four times a day (QID) | ORAL | 0 refills | Status: DC | PRN

## 2016-04-29 SURGICAL SUPPLY — 20 items
BLADE SURG SZ11 CARB STEEL (BLADE) ×3 IMPLANT
CATH ROBINSON RED A/P 16FR (CATHETERS) ×3 IMPLANT
CHLORAPREP W/TINT 26ML (MISCELLANEOUS) ×3 IMPLANT
DRSG TEGADERM 2-3/8X2-3/4 SM (GAUZE/BANDAGES/DRESSINGS) ×3 IMPLANT
GLOVE BIO SURGEON STRL SZ7 (GLOVE) ×12 IMPLANT
GLOVE INDICATOR 7.5 STRL GRN (GLOVE) ×12 IMPLANT
GOWN STRL REUS W/ TWL LRG LVL3 (GOWN DISPOSABLE) ×2 IMPLANT
GOWN STRL REUS W/TWL LRG LVL3 (GOWN DISPOSABLE) ×4
KIT DISPOSABLE FALLOPE RING (Ring) ×3 IMPLANT
KIT RM TURNOVER CYSTO AR (KITS) ×3 IMPLANT
LABEL OR SOLS (LABEL) ×3 IMPLANT
LIQUID BAND (GAUZE/BANDAGES/DRESSINGS) ×3 IMPLANT
NS IRRIG 500ML POUR BTL (IV SOLUTION) ×3 IMPLANT
PACK GYN LAPAROSCOPIC (MISCELLANEOUS) ×3 IMPLANT
PAD OB MATERNITY 4.3X12.25 (PERSONAL CARE ITEMS) ×3 IMPLANT
PAD PREP 24X41 OB/GYN DISP (PERSONAL CARE ITEMS) ×3 IMPLANT
RING FALLOPIAN BANDS (Ring) ×3 IMPLANT
SUT MNCRL AB 4-0 PS2 18 (SUTURE) ×3 IMPLANT
TROCAR XCEL NON-BLD 5MMX100MML (ENDOMECHANICALS) ×3 IMPLANT
TUBING INSUFFLATOR HI FLOW (MISCELLANEOUS) ×3 IMPLANT

## 2016-04-29 NOTE — Op Note (Signed)
Preoperative Diagnosis: 1) 31 y.o.  [id number] desiring permanent surgical sterilization  Postoperative Diagnosis: 1) 31 y.o. [id number] desiring permanent surgical sterilization  Operation Performed: Laparoscopic bilateral tubal ligation via falope ring application  Indication: 31 y.o. [id number]  with undesired fertility, desires permanent sterilization.  Other reversible forms of contraception were discussed with patient; she declines all other modalities. Permanent nature of as well as associated risks of the procedure discussed with patient including but not limited to: risk of regret, permanence of method, bleeding, infection, injury to surrounding organs and need for additional procedures.  Failure risk of 0.5-1% with increased risk of ectopic gestation if pregnancy occurs was also discussed with patient.    Surgeon: Vena AustriaAndreas Lavontae Cornia, MD  Anesthesia: General  Preoperative Antibiotics: none  Estimated Blood Loss: 5mL  IV Fluids: 600mL  Urine Output:: 400mL  Drains or Tubes: none  Implants: none  Specimens Removed: none  Complications: none  Intraoperative Findings: Normal tubes, ovaries, and uterus.  Good 1cm knuckle of tube applied within each falope ring.  Patient Condition: stable  Procedure in Detail:  Patient was taken to the operating room where she was administered general anesthesia.  She was positioned in the dorsal lithotomy position utilizing Allen stirups, prepped and draped in the usual sterile fashion.  Prior to proceeding with procedure a time out was performed.  Attention was turned to the patient's pelvis.  A red rubber catheter was used to empty the patient's bladder.  An operative speculum was placed to allow visualization of the cervix.  The anterior lip of the cervix was grasped with a single tooth tenaculum, and a Hulka tenaculum was placed to allow manipulation of the uterus.  The operative speculum and single tooth tenaculum were then removed.  Attention was  turned to the patient's abdomen.  The umbilicus was infiltrated with 1% Sensorcaine, before making a stab incision using an 11 blade scalpel.  A 5mm Excel trocar was then used to gain direct entry into the peritoneal cavity utilizing the camera to visualize progress of the trocar during placement.  Once peritoneal entry had been achieved, insufflation was started and pneumoperitoneum established at a pressure of 15mmHg.   General inspection of the abdomen revealed the above noted findings.  A spot 2cm midline above the pubic symphysis was injected with 1% Sensorcaine and a stab incision was made using an 11 blade scalpel.  The 8mm falope ring trocar was place suprapubic through this incision under direct visualization.  The right tube was identified and grasped in a mid-isthmic portion using the falope ring application.  The falope ring was applied but did not contain and adequate portion of tube so a secon Falope ring was applied with a good 1cm knuckle of blanching tube noted within the falope ring after application of the second ring.  The left tube was identified and a second falope ring was applied in a similar fashion.  Pneumoperitoneum was evacuated.  The trocars were removed.  The 8mm trocar site was closed with 4-0 Monocryl in a subcuticular fashion.  All trocar sites were then dressed with surgical skin glue.  The Hulka tenaculum was removed.  Sponge needle and instrument counts were correct time two.  The patient tolerated the procedure well and was taken to the recovery room in stable condition.

## 2016-04-29 NOTE — H&P (Signed)
Obstetrics & Gynecology H&P    Chief Complaint: "Here for surgery"   History of Present Illness: Patient is a 31 y.o. [id number] s/p NSVD on 01/12/2016, [facility]  who desires permanent surgical sterilization.    Review of Systems:10 point review of systems  Past Medical History:  Past Medical History:  Diagnosis Date  . Anemia    H/O  . Heart murmur    ASYMPTOMATIC  . Pregnancy induced hypertension     Past Surgical History:  Past Surgical History:  Procedure Laterality Date  . NO PAST SURGERIES      Obstetric History: [id number] TSVD x 3  Family History:  Family History  Problem Relation Age of Onset  . Stroke Mother   . Diabetes Maternal Grandmother   . Heart disease Maternal Grandmother     Social History:  Social History   Social History  . Marital status: Single    Spouse name: N/A  . Number of children: N/A  . Years of education: N/A   Occupational History  . Not on file.   Social History Main Topics  . Smoking status: Former Smoker    Packs/day: 0.25    Years: 4.00    Types: Cigarettes    Quit date: 04/25/2010  . Smokeless tobacco: Never Used  . Alcohol use Yes     Comment: OCC  . Drug use: No  . Sexual activity: Yes    Birth control/ protection: Condom   Other Topics Concern  . Not on file   Social History Narrative  . No narrative on file    Allergies:  No Known Allergies  Medications: Prior to Admission medications   Not on File    Physical Exam Vitals: Blood pressure 123/90, pulse 83, temperature 98.1 F (36.7 C), resp. rate 18, height 5' (1.524 m), weight 140 lb (63.5 kg), last menstrual period 04/23/2016, SpO2 100 %, unknown if currently breastfeeding. General: NAD HEENT: normocephalic, anicteric Pulmonary: no increased work of breathing Cardiovascular: regular rate Abdomen: soft, non-tender Genitourinary: deferred Extremities: no edema Neurologic: grossly intact Psychiatric: mood appropriate, affect  full  Labs: Results for orders placed or performed during the hospital encounter of 04/29/16 (from the past 72 hour(s))  Pregnancy, urine POC     Status: None   Collection Time: 04/29/16 11:57 AM  Result Value Ref Range   Preg Test, Ur NEGATIVE NEGATIVE    Comment:        THE SENSITIVITY OF THIS METHODOLOGY IS >24 mIU/mL     Imaging No results found.  Assessment: 31 y.o. [id number] presenting for schedule laparoscopic BTL  Plan: - clear to proceed to OR if urine pregnancy test negative - no preoperative antibiotics required - SCD for DVT ppx

## 2016-04-29 NOTE — Discharge Instructions (Signed)
Laparoscopic Tubal Ligation, Care After Refer to this sheet in the next few weeks. These instructions provide you with information about caring for yourself after your procedure. Your health care provider may also give you more specific instructions. Your treatment has been planned according to current medical practices, but problems sometimes occur. Call your health care provider if you have any problems or questions after your procedure. What can I expect after the procedure? After the procedure, it is common to have:  A sore throat.  Discomfort in your shoulder.  Mild discomfort or cramping in your abdomen.  Gas pains.  Pain or soreness in the area where the surgical cut (incision) was made.  A bloated feeling.  Tiredness.  Nausea.  Vomiting. Follow these instructions at home: Medicines  Take over-the-counter and prescription medicines only as told by your health care provider.  Do not take aspirin because it can cause bleeding.  Do not drive or operate heavy machinery while taking prescription pain medicine. Activity  Rest for the rest of the day.  Return to your normal activities as told by your health care provider. Ask your health care provider what activities are safe for you. Incision care  Follow instructions from your health care provider about how to take care of your incision. Make sure you:  Wash your hands with soap and water before you change your bandage (dressing). If soap and water are not available, use hand sanitizer.  Change your dressing as told by your health care provider.  Leave stitches (sutures) in place. They may need to stay in place for 2 weeks or longer.  Check your incision area every day for signs of infection. Check for:  More redness, swelling, or pain.  More fluid or blood.  Warmth.  Pus or a bad smell. Other Instructions  Do not take baths, swim, or use a hot tub until your health care provider approves. You may take  showers.  Keep all follow-up visits as told by your health care provider. This is important.  Have someone help you with your daily household tasks for the first few days. Contact a health care provider if:  You have more redness, swelling, or pain around your incision.  Your incision feels warm to the touch.  You have pus or a bad smell coming from your incision.  The edges of your incision break open after the sutures have been removed.  Your pain does not improve after 2-3 days.  You have a rash.  You repeatedly become dizzy or light-headed.  Your pain medicine is not helping.  You are constipated. Get help right away if:  You have a fever.  You faint.  You have increasing pain in your abdomen.  You have severe pain in one or both of your shoulders.  You have fluid or blood coming from your sutures or from your vagina.  You have shortness of breath or difficulty breathing.  You have chest pain or leg pain.  You have ongoing nausea, vomiting, or diarrhea. This information is not intended to replace advice given to you by your health care provider. Make sure you discuss any questions you have with your health care provider. Document Released: 10/22/2004 Document Revised: 09/07/2015 Document Reviewed: 03/15/2015 Elsevier Interactive Patient Education  2017 [employer]   1) The drugs that you were given will stay in your system until tomorrow so for the next 24 hours you should not:  A) Drive an automobile B) Make  any legal decisions C) Drink any alcoholic beverage   2) You may resume regular meals tomorrow.  Today it is better to start with liquids and gradually work up to solid foods.  You may eat anything you prefer, but it is better to start with liquids, then soup and crackers, and gradually work up to solid foods.   3) Please notify your doctor immediately if you have any unusual bleeding, trouble  breathing, redness and pain at the surgery site, drainage, fever, or pain not relieved by medication.    4) Additional Instructions:        Please contact your physician with any problems or Same Day Surgery at [phone], Monday through Friday 6 am to 4 pm, or [place] [facility] Main number at [phone].

## 2016-04-29 NOTE — Anesthesia Procedure Notes (Signed)
Procedure Name: Intubation Date/Time: 04/29/2016 1:20 PM Performed by: Roshan Salamon Pre-anesthesia Checklist: Patient identified, Emergency Drugs available, Suction available, Patient being monitored and Timeout performed Oxygen Delivery Method: Circle system utilized Preoxygenation: Pre-oxygenation with 100% oxygen Intubation Type: IV induction Ventilation: Mask ventilation without difficulty Laryngoscope Size: Mac and 3 Grade View: Grade I Tube type: Oral Tube size: 7.0 mm Number of attempts: 1 Airway Equipment and Method: Stylet Placement Confirmation: ETT inserted through vocal cords under direct vision,  positive ETCO2 and breath sounds checked- equal and bilateral Secured at: 22 cm Dental Injury: Teeth and Oropharynx as per pre-operative assessment

## 2016-04-29 NOTE — Anesthesia Preprocedure Evaluation (Signed)
Anesthesia Evaluation  Patient identified by MRN, date of birth, ID band Patient awake    Reviewed: Allergy & Precautions, H&P , NPO status , Patient's Chart, lab work & pertinent test results  History of Anesthesia Complications Negative for: history of anesthetic complications  Airway Mallampati: III  TM Distance: >3 FB Neck ROM: full    Dental no notable dental hx. (+) Poor Dentition, Chipped Multiple chips:   Pulmonary neg shortness of breath, former smoker,    Pulmonary exam normal breath sounds clear to auscultation       Cardiovascular Exercise Tolerance: Good hypertension, (-) angina(-) Past MI and (-) DOE Normal cardiovascular exam Rhythm:regular Rate:Normal     Neuro/Psych negative neurological ROS  negative psych ROS   GI/Hepatic negative GI ROS, Neg liver ROS, neg GERD  ,  Endo/Other  negative endocrine ROS  Renal/GU      Musculoskeletal   Abdominal   Peds  Hematology negative hematology ROS (+)   Anesthesia Other Findings Past Medical History: No date: Anemia     Comment: H/O No date: Heart murmur     Comment: ASYMPTOMATIC No date: Pregnancy induced hypertension  Past Surgical History: No date: NO PAST SURGERIES  BMI    Body Mass Index:  27.34 kg/m      Reproductive/Obstetrics negative OB ROS                             Anesthesia Physical Anesthesia Plan  ASA: III  Anesthesia Plan: General ETT   Post-op Pain Management:    Induction:   Airway Management Planned:   Additional Equipment:   Intra-op Plan:   Post-operative Plan:   Informed Consent: I have reviewed the patients History and Physical, chart, labs and discussed the procedure including the risks, benefits and alternatives for the proposed anesthesia with the patient or authorized representative who has indicated his/her understanding and acceptance.   Dental Advisory Given  Plan Discussed  with: Anesthesiologist, CRNA and Surgeon  Anesthesia Plan Comments:         Anesthesia Quick Evaluation

## 2016-04-29 NOTE — Transfer of Care (Signed)
Immediate Anesthesia Transfer of Care Note  Patient: Stefanie FootCassandra Mason  Procedure(s) Performed: Procedure(s): LAPAROSCOPIC TUBAL LIGATION (Bilateral)  Patient Location: PACU  Anesthesia Type:General  Level of Consciousness: sedated  Airway & Oxygen Therapy: Patient Spontanous Breathing and Patient connected to face mask oxygen  Post-op Assessment: Report given to RN and Post -op Vital signs reviewed and stable  Post vital signs: Reviewed and stable  Last Vitals:  Vitals:   04/29/16 1151 04/29/16 1436  BP: 123/90 140/79  Pulse: 83 (!) 51  Resp: 18 10  Temp: 36.7 C 36.3 C    Last Pain:  Vitals:   04/29/16 1436  PainSc: 8          Complications: No apparent anesthesia complications

## 2016-05-01 ENCOUNTER — Emergency Department
Admission: EM | Admit: 2016-05-01 | Discharge: 2016-05-01 | Disposition: A | Discharge status: Home / Self Care | Payer: Medicaid Other | Attending: Emergency Medicine | Admitting: Emergency Medicine

## 2016-05-01 DIAGNOSIS — Z87891 Personal history of nicotine dependence: Secondary | ICD-10-CM | POA: Diagnosis not present

## 2016-05-01 DIAGNOSIS — Y658 Other specified misadventures during surgical and medical care: Secondary | ICD-10-CM | POA: Insufficient documentation

## 2016-05-01 DIAGNOSIS — T8130XA Disruption of wound, unspecified, initial encounter: Secondary | ICD-10-CM | POA: Diagnosis not present

## 2016-05-01 DIAGNOSIS — T814XXA Infection following a procedure, initial encounter: Secondary | ICD-10-CM | POA: Diagnosis present

## 2016-05-01 NOTE — ED Provider Notes (Signed)
[facility] Emergency Department Provider Note  ____________________________________________   First MD Initiated Contact with Patient 05/01/16 1608     (approximate)  I have reviewed the triage vital signs and the nursing notes.   HISTORY  Chief Complaint Post-op Problem   HPI Stefanie Mason is a 31 y.o. female with a history of anemia who is postop day 2 from a laparoscopic tubal ligation with presenting to the emergency department with wound dehiscence. Said that she about a 1 minute wound that the glue released from on the lower portion of her umbilicus. She denies any pain. Denies any bleeding or discharge. She called the OB/GYN office who told her to come to the emergency department for further evaluation.   Past Medical History:  Diagnosis Date  . Anemia    H/O  . Heart murmur    ASYMPTOMATIC  . Pregnancy induced hypertension     Patient Active Problem List   Diagnosis Date Noted  . Postpartum care following vaginal delivery 01/14/2016  . Gestational hypertension 01/11/2016  . Labor and delivery, indication for care 10/24/2015    Past Surgical History:  Procedure Laterality Date  . NO PAST SURGERIES      Prior to Admission medications   Medication Sig Start Date End Date Taking? Authorizing Provider  ibuprofen (ADVIL,MOTRIN) 600 MG tablet Take 1 tablet (600 mg total) by mouth every 6 (six) hours as needed. 04/29/16   Vena AustriaAndreas Staebler, MD  oxyCODONE-acetaminophen (PERCOCET/ROXICET) 5-325 MG tablet Take 1-2 tablets by mouth every 6 (six) hours as needed. 04/29/16   Vena AustriaAndreas Staebler, MD    Allergies Patient has no known allergies.  Family History  Problem Relation Age of Onset  . Stroke Mother   . Diabetes Maternal Grandmother   . Heart disease Maternal Grandmother     Social History Social History  Substance Use Topics  . Smoking status: Former Smoker    Packs/day: 0.25    Years: 4.00    Types: Cigarettes    Quit date:  04/25/2010  . Smokeless tobacco: Never Used  . Alcohol use Yes     Comment: OCC    Review of Systems Constitutional: No fever/chills Eyes: No visual changes. ENT: No sore throat. Cardiovascular: Denies chest pain. Respiratory: Denies shortness of breath. Gastrointestinal: No abdominal pain.  No nausea, no vomiting.  No diarrhea.  No constipation. Genitourinary: Negative for dysuria. Musculoskeletal: Negative for back pain. Skin: Negative for rash. Neurological: Negative for headaches, focal weakness or numbness.  10-point ROS otherwise negative.  ____________________________________________   PHYSICAL EXAM:  VITAL SIGNS: ED Triage Vitals  Enc Vitals Group     BP 05/01/16 1501 125/86     Pulse Rate 05/01/16 1501 77     Resp 05/01/16 1501 16     Temp 05/01/16 1501 98 F (36.7 C)     Temp Source 05/01/16 1501 Oral     SpO2 05/01/16 1501 100 %     Weight 05/01/16 1502 140 lb (63.5 kg)     Height 05/01/16 1502 5' (1.524 m)     Head Circumference --      Peak Flow --      Pain Score --      Pain Loc --      Pain Edu? --      Excl. in GC? --     Constitutional: Alert and oriented. Well appearing and in no acute distress. Eyes: Conjunctivae are normal. PERRL. EOMI. Head: Atraumatic. Nose: No congestion/rhinnorhea. Mouth/Throat: Mucous membranes  are moist.   Neck: No stridor.   Cardiovascular: Normal rate, regular rhythm. Grossly normal heart sounds.  Good peripheral circulation. Respiratory: Normal respiratory effort.  No retractions. Lungs CTAB. Gastrointestinal: Soft and nontender. No distention. Musculoskeletal: No lower extremity tenderness nor edema.  No joint effusions. Neurologic:  Normal speech and language. No gross focal neurologic deficits are appreciated. No gait instability. Skin:  Skin is warm, dry.  1 cm, vertical and superficial wound dehiscence. It is of only several millimeters deep. There is no tenderness. No discharge. No stranding erythema or  induration. Psychiatric: Mood and affect are normal. Speech and behavior are normal.  ____________________________________________   LABS (all labs ordered are listed, but only abnormal results are displayed)  Labs Reviewed - No data to display ____________________________________________  EKG   ____________________________________________  RADIOLOGY   ____________________________________________   PROCEDURES  Procedure(s) performed:   Procedures  Critical Care performed:   ____________________________________________   INITIAL IMPRESSION / ASSESSMENT AND PLAN / ED COURSE  Pertinent labs & imaging results that were available during my care of the patient were reviewed by me and considered in my medical decision making (see chart for details).    Clinical Course   We will apply a Steri-Strip to the patient's wound. She'll be given several Steri-Strips for home use. She'll be following up in one week with her OB/GYN as scheduled appointment.  The wound does not appear to communicate deeply.  Specifically, does not appear to communicate into the deep soft tissue or through to the abdomen. The patient will keep the area dry for 24 hours and then resume showering not bathing is normal thereafter.   ____________________________________________   FINAL CLINICAL IMPRESSION(S) / ED DIAGNOSES  Wound dehiscence.    NEW MEDICATIONS STARTED DURING THIS VISIT:  New Prescriptions   No medications on file     Note:  This document was prepared using Dragon voice recognition software and may include unintentional dictation errors.    Myrna Blazer, MD 05/01/16 [phone]

## 2016-05-01 NOTE — ED Triage Notes (Addendum)
Pt had tubal ligation done on Friday. Wound incision has opened under umbilicus. No drainage present at this time. Denies pain.  Approx 1 inch vertical incision under umbilicus.

## 2016-05-01 NOTE — ED Notes (Signed)
Patient c/o post op glue fell off and opening wound at umblicus s/p tubal ligation via Staebler MD.   No signs of infection. No other complaints. Wound site is clean and dry

## 2016-05-02 ENCOUNTER — Encounter: Payer: Self-pay | Admitting: Obstetrics and Gynecology

## 2016-05-02 NOTE — Anesthesia Postprocedure Evaluation (Signed)
Anesthesia Post Note  Patient: Stefanie Mason  Procedure(s) Performed: Procedure(s) (LRB): LAPAROSCOPIC TUBAL LIGATION (Bilateral)  Patient location during evaluation: PACU Anesthesia Type: General Level of consciousness: awake and alert Pain management: pain level controlled Vital Signs Assessment: post-procedure vital signs reviewed and stable Respiratory status: spontaneous breathing, nonlabored ventilation, respiratory function stable and patient connected to nasal cannula oxygen Cardiovascular status: blood pressure returned to baseline and stable Postop Assessment: no signs of nausea or vomiting Anesthetic complications: no      Last Pain:  Vitals:   04/29/16 1623  TempSrc: Temporal  PainSc: 2                  Cleda MccreedyJoseph K Thuy Atilano

## 2016-05-26 ENCOUNTER — Emergency Department
Admission: EM | Admit: 2016-05-26 | Discharge: 2016-05-26 | Disposition: A | Discharge status: Home / Self Care | Payer: Medicaid Other | Attending: Emergency Medicine | Admitting: Emergency Medicine

## 2016-05-26 ENCOUNTER — Encounter: Payer: Self-pay | Admitting: Emergency Medicine

## 2016-05-26 DIAGNOSIS — K529 Noninfective gastroenteritis and colitis, unspecified: Secondary | ICD-10-CM | POA: Diagnosis not present

## 2016-05-26 DIAGNOSIS — Z87891 Personal history of nicotine dependence: Secondary | ICD-10-CM | POA: Diagnosis not present

## 2016-05-26 DIAGNOSIS — R1110 Vomiting, unspecified: Secondary | ICD-10-CM | POA: Diagnosis present

## 2016-05-26 LAB — URINALYSIS, COMPLETE (UACMP) WITH MICROSCOPIC
BILIRUBIN URINE: NEGATIVE
Bacteria, UA: NONE SEEN
Glucose, UA: NEGATIVE mg/dL
HGB URINE DIPSTICK: NEGATIVE
KETONES UR: 5 mg/dL — AB
LEUKOCYTES UA: NEGATIVE
Nitrite: NEGATIVE
Protein, ur: NEGATIVE mg/dL
RBC / HPF: NONE SEEN RBC/hpf (ref 0–5)
SPECIFIC GRAVITY, URINE: 1.01 (ref 1.005–1.030)
pH: 5 (ref 5.0–8.0)

## 2016-05-26 LAB — COMPREHENSIVE METABOLIC PANEL
ALBUMIN: 5 g/dL (ref 3.5–5.0)
ALT: 15 U/L (ref 14–54)
ANION GAP: 11 (ref 5–15)
AST: 25 U/L (ref 15–41)
Alkaline Phosphatase: 83 U/L (ref 38–126)
BUN: 20 mg/dL (ref 6–20)
CHLORIDE: 103 mmol/L (ref 101–111)
CO2: 25 mmol/L (ref 22–32)
Calcium: 9 mg/dL (ref 8.9–10.3)
Creatinine, Ser: 0.87 mg/dL (ref 0.44–1.00)
GFR calc Af Amer: >60 mL/min (ref >60)
GLUCOSE: 126 mg/dL — AB (ref 65–99)
POTASSIUM: 3.5 mmol/L (ref 3.5–5.1)
Sodium: 139 mmol/L (ref 135–145)
Total Bilirubin: 0.8 mg/dL (ref 0.3–1.2)
Total Protein: 8.2 g/dL — ABNORMAL HIGH (ref 6.5–8.1)

## 2016-05-26 LAB — POCT PREGNANCY, URINE: PREG TEST UR: NEGATIVE

## 2016-05-26 LAB — CBC
HCT: 42.7 % (ref 35.0–47.0)
Hemoglobin: 14.2 g/dL (ref 12.0–16.0)
MCH: 26.9 pg (ref 26.0–34.0)
MCHC: 33.3 g/dL (ref 32.0–36.0)
MCV: 80.8 fL (ref 80.0–100.0)
Platelets: 228 10*3/uL (ref 150–440)
RBC: 5.29 MIL/uL — ABNORMAL HIGH (ref 3.80–5.20)
RDW: 13.6 % (ref 11.5–14.5)
WBC: 10.1 10*3/uL (ref 3.6–11.0)

## 2016-05-26 LAB — LIPASE, BLOOD: LIPASE: 22 U/L (ref 11–51)

## 2016-05-26 MED ORDER — ONDANSETRON HCL 4 MG/2ML IJ SOLN
4.0000 mg | Freq: Once | INTRAMUSCULAR | Status: AC | PRN
  Administered 2016-05-26: 4 mg via INTRAVENOUS

## 2016-05-26 MED ORDER — SODIUM CHLORIDE 0.9 % IV BOLUS (SEPSIS)
1000.0000 mL | Freq: Once | INTRAVENOUS | Status: AC
  Administered 2016-05-26: 1000 mL via INTRAVENOUS

## 2016-05-26 MED ORDER — ONDANSETRON HCL 4 MG PO TABS: 4.0000 mg | ORAL_TABLET | Freq: Three times a day (TID) | ORAL | 0 refills | Status: DC | PRN

## 2016-05-26 MED ORDER — ONDANSETRON HCL 4 MG/2ML IJ SOLN
INTRAMUSCULAR | Status: AC
  Filled 2016-05-26: qty 2

## 2016-05-26 NOTE — ED Notes (Signed)
Pt reports she cannot provide urine sample at this time.

## 2016-05-26 NOTE — ED Notes (Signed)
Pt given Ginger Ale for PO change. Pt attempting to provide urine sample at this time.

## 2016-05-26 NOTE — ED Provider Notes (Signed)
[name] Emergency Department Provider Note  ____________________________________________  Time seen: Approximately 2:59 PM  I have reviewed the triage vital signs and the nursing notes.   HISTORY  Chief Complaint Emesis   HPI Stefanie Mason is a 31 y.o. female with a history of anemia and tubal ligation who presents for evaluation of vomiting and diarrhea. Patient reports that her symptoms started this morning. She has had 5 episodes of nonbloody nonbilious emesis and 3 or 4 watery diarrhea. No melena, no hematemesis, no coffee-ground emesis, no hematochezia. She denies abdominal pain. She has had chills but no fever, no chest pain or shortness of breath, no cough, no body aches. She denies dizziness.  Past Medical History:  Diagnosis Date  . Anemia    H/O  . Heart murmur    ASYMPTOMATIC  . Pregnancy induced hypertension     Patient Active Problem List   Diagnosis Date Noted  . Postpartum care following vaginal delivery 01/14/2016  . Gestational hypertension 01/11/2016  . Labor and delivery, indication for care 10/24/2015    Past Surgical History:  Procedure Laterality Date  . LAPAROSCOPIC TUBAL LIGATION Bilateral 04/29/2016   Procedure: LAPAROSCOPIC TUBAL LIGATION;  Surgeon: Vena Austria, MD;  Location: [facility] ORS;  Service: Gynecology;  Laterality: Bilateral;  . NO PAST SURGERIES      Prior to Admission medications   Medication Sig Start Date End Date Taking? Authorizing Provider  ibuprofen (ADVIL,MOTRIN) 600 MG tablet Take 1 tablet (600 mg total) by mouth every 6 (six) hours as needed. 04/29/16   Vena Austria, MD  ondansetron (ZOFRAN) 4 MG tablet Take 1 tablet (4 mg total) by mouth every 8 (eight) hours as needed for nausea or vomiting. 05/26/16   Nita Sickle, MD  oxyCODONE-acetaminophen (PERCOCET/ROXICET) 5-325 MG tablet Take 1-2 tablets by mouth every 6 (six) hours as needed. 04/29/16   Vena Austria, MD     Allergies Patient has no known allergies.  Family History  Problem Relation Age of Onset  . Stroke Mother   . Diabetes Maternal Grandmother   . Heart disease Maternal Grandmother     Social History Social History  Substance Use Topics  . Smoking status: Former Smoker    Packs/day: 0.25    Years: 4.00    Types: Cigarettes    Quit date: 04/25/2010  . Smokeless tobacco: Never Used  . Alcohol use Yes     Comment: OCC    Review of Systems  Constitutional: Negative for fever. Eyes: Negative for visual changes. ENT: Negative for sore throat. Neck: No neck pain  Cardiovascular: Negative for chest pain. Respiratory: Negative for shortness of breath. Gastrointestinal: Negative for abdominal pain. + vomiting and diarrhea. Genitourinary: Negative for dysuria. Musculoskeletal: Negative for back pain. Skin: Negative for rash. Neurological: Negative for headaches, weakness or numbness. Psych: No SI or HI  ____________________________________________   PHYSICAL EXAM:  VITAL SIGNS: ED Triage Vitals  Enc Vitals Group     BP 05/26/16 1250 117/83     Pulse Rate 05/26/16 1250 (!) 112     Resp 05/26/16 1250 17     Temp 05/26/16 1250 98 F (36.7 C)     Temp Source 05/26/16 1250 Oral     SpO2 05/26/16 1250 99 %     Weight 05/26/16 1252 140 lb (63.5 kg)     Height 05/26/16 1252 5' (1.524 m)     Head Circumference --      Peak Flow --      Pain  Score 05/26/16 1252 0     Pain Loc --      Pain Edu? --      Excl. in GC? --     Constitutional: Alert and oriented. Well appearing and in no apparent distress. HEENT:      Head: Normocephalic and atraumatic.         Eyes: Conjunctivae are normal. Sclera is non-icteric. EOMI. PERRL      Mouth/Throat: Mucous membranes are moist.       Neck: Supple with no signs of meningismus. Cardiovascular: Tachycardic with regular rhythm. No murmurs, gallops, or rubs. 2+ symmetrical distal pulses are present in all extremities. No  JVD. Respiratory: Normal respiratory effort. Lungs are clear to auscultation bilaterally. No wheezes, crackles, or rhonchi.  Gastrointestinal: Soft, non tender, and non distended with positive bowel sounds. No rebound or guarding. Musculoskeletal: Nontender with normal range of motion in all extremities. No edema, cyanosis, or erythema of extremities. Neurologic: Normal speech and language. Face is symmetric. Moving all extremities. No gross focal neurologic deficits are appreciated. Skin: Skin is warm, dry and intact. No rash noted. Psychiatric: Mood and affect are normal. Speech and behavior are normal.  ____________________________________________   LABS (all labs ordered are listed, but only abnormal results are displayed)  Labs Reviewed  COMPREHENSIVE METABOLIC PANEL - Abnormal; Notable for the following:       Result Value   Glucose, Bld 126 (*)    Total Protein 8.2 (*)    All other components within normal limits  CBC - Abnormal; Notable for the following:    RBC 5.29 (*)    All other components within normal limits  URINALYSIS, COMPLETE (UACMP) WITH MICROSCOPIC - Abnormal; Notable for the following:    Color, Urine STRAW (*)    APPearance CLEAR (*)    Ketones, ur 5 (*)    Squamous Epithelial / LPF 6-30 (*)    All other components within normal limits  LIPASE, BLOOD  POCT PREGNANCY, URINE  TYPE AND SCREEN  TYPE AND SCREEN   ____________________________________________  EKG  none ____________________________________________  RADIOLOGY  none  ____________________________________________   PROCEDURES  Procedure(s) performed: None Procedures Critical Care performed:  None ____________________________________________   INITIAL IMPRESSION / ASSESSMENT AND PLAN / ED COURSE   31 y.o. female with a history of anemia who presents for evaluation of vomiting and diarrhea since this morning. Patient is well-appearing, in no distress, has mild tachycardia with a pulse  of 112, afebrile, abdomen is soft and nontender. Blood work showing normal CBC, normal CMP, normal lipase. Presentation concerning for viral gastroenteritis. We'll give IV fluids, IV Zofran, and reassess.    ED COURSE:  Patient tolerating by mouth. Vital signs improved after 2 L of normal saline. No longer having vomiting or diarrhea in the emergency room. Will dc home on Zofran, increase oral hydration, follow-up with primary care doctor.  Pertinent labs & imaging results that were available during my care of the patient were reviewed by me and considered in my medical decision making (see chart for details).    ____________________________________________   FINAL CLINICAL IMPRESSION(S) / ED DIAGNOSES  Final diagnoses:  Gastroenteritis      NEW MEDICATIONS STARTED DURING THIS VISIT:  New Prescriptions   ONDANSETRON (ZOFRAN) 4 MG TABLET    Take 1 tablet (4 mg total) by mouth every 8 (eight) hours as needed for nausea or vomiting.     Note:  This document was prepared using Conservation officer, historic buildings and may  include unintentional dictation errors.    Nita Sicklearolina Shermon Bozzi, MD 05/26/16 [phone]

## 2016-05-26 NOTE — ED Triage Notes (Signed)
Patient presents to ED via POV with c/o emesis since last night. Patient states she ate a sub last night and has not felt well since. Patient denies abdominal pain. Patient is very pale in the face. Cyanosis noted around mouth, SpO2 WNL. Hx of anemia. Patient A&O x4.

## 2017-01-22 ENCOUNTER — Emergency Department
Admission: EM | Admit: 2017-01-22 | Discharge: 2017-01-22 | Disposition: A | Discharge status: Home / Self Care | Payer: Self-pay | Attending: Emergency Medicine | Admitting: Emergency Medicine

## 2017-01-22 ENCOUNTER — Encounter: Payer: Self-pay | Admitting: Emergency Medicine

## 2017-01-22 DIAGNOSIS — Z87891 Personal history of nicotine dependence: Secondary | ICD-10-CM | POA: Insufficient documentation

## 2017-01-22 DIAGNOSIS — K029 Dental caries, unspecified: Secondary | ICD-10-CM | POA: Insufficient documentation

## 2017-01-22 DIAGNOSIS — Z791 Long term (current) use of non-steroidal anti-inflammatories (NSAID): Secondary | ICD-10-CM | POA: Insufficient documentation

## 2017-01-22 DIAGNOSIS — D649 Anemia, unspecified: Secondary | ICD-10-CM | POA: Insufficient documentation

## 2017-01-22 MED ORDER — LIDOCAINE VISCOUS 2 % MT SOLN
15.0000 mL | Freq: Once | OROMUCOSAL | Status: AC
  Administered 2017-01-22: 15 mL via OROMUCOSAL
  Filled 2017-01-22: qty 15

## 2017-01-22 MED ORDER — IBUPROFEN 800 MG PO TABS
800.0000 mg | ORAL_TABLET | Freq: Once | ORAL | Status: AC
  Administered 2017-01-22: 800 mg via ORAL
  Filled 2017-01-22: qty 1

## 2017-01-22 MED ORDER — IBUPROFEN 600 MG PO TABS: 600.0000 mg | ORAL_TABLET | Freq: Three times a day (TID) | ORAL | 0 refills | Status: DC | PRN

## 2017-01-22 MED ORDER — AMOXICILLIN 500 MG PO CAPS: 500.0000 mg | ORAL_CAPSULE | Freq: Three times a day (TID) | ORAL | 0 refills | Status: DC

## 2017-01-22 MED ORDER — TRAMADOL HCL 50 MG PO TABS: 50.0000 mg | ORAL_TABLET | Freq: Four times a day (QID) | ORAL | 0 refills | Status: DC | PRN

## 2017-01-22 MED ORDER — LIDOCAINE VISCOUS 2 % MT SOLN: 5.0000 mL | Freq: Four times a day (QID) | OROMUCOSAL | 0 refills | Status: DC | PRN

## 2017-01-22 NOTE — ED Triage Notes (Signed)
Pt arrives POV to triage with c/o lower left dental pain that has been "on and off" for a year since her filling fell out. Pt is ambulatory to triage with NAD.

## 2017-01-22 NOTE — ED Provider Notes (Signed)
[facility] Emergency Department Provider Note   ____________________________________________   First MD Initiated Contact with Patient 01/22/17 2129     (approximate)  I have reviewed the triage vital signs and the nursing notes.   HISTORY  Chief Complaint Dental Pain    HPI Stefanie Mason is a 31 y.o. female patient complaining of acute dental pain secondaryto filling falling out last year. Patient state pain increased today. Patient rates pain as a 10 over 10. No palliative measures for complaint. Patient described a pain as "achy".  Past Medical History:  Diagnosis Date  . Anemia    H/O  . Heart murmur    ASYMPTOMATIC  . Pregnancy induced hypertension     Patient Active Problem List   Diagnosis Date Noted  . Postpartum care following vaginal delivery 01/14/2016  . Gestational hypertension 01/11/2016  . Labor and delivery, indication for care 10/24/2015    Past Surgical History:  Procedure Laterality Date  . LAPAROSCOPIC TUBAL LIGATION Bilateral 04/29/2016   Procedure: LAPAROSCOPIC TUBAL LIGATION;  Surgeon: Vena Austria, MD;  Location: [facility] ORS;  Service: Gynecology;  Laterality: Bilateral;  . NO PAST SURGERIES      Prior to Admission medications   Medication Sig Start Date End Date Taking? Authorizing Provider  amoxicillin (AMOXIL) 500 MG capsule Take 1 capsule (500 mg total) by mouth 3 (three) times daily. 01/22/17   Joni Reining, PA-C  ibuprofen (ADVIL,MOTRIN) 600 MG tablet Take 1 tablet (600 mg total) by mouth every 6 (six) hours as needed. 04/29/16   Vena Austria, MD  ibuprofen (ADVIL,MOTRIN) 600 MG tablet Take 1 tablet (600 mg total) by mouth every 8 (eight) hours as needed. 01/22/17   Joni Reining, PA-C  lidocaine (XYLOCAINE) 2 % solution Use as directed 5 mLs in the mouth or throat every 6 (six) hours as needed for mouth pain. 01/22/17   Joni Reining, PA-C  ondansetron (ZOFRAN) 4 MG tablet Take 1 tablet (4 mg total)  by mouth every 8 (eight) hours as needed for nausea or vomiting. 05/26/16   Nita Sickle, MD  oxyCODONE-acetaminophen (PERCOCET/ROXICET) 5-325 MG tablet Take 1-2 tablets by mouth every 6 (six) hours as needed. 04/29/16   Vena Austria, MD  traMADol (ULTRAM) 50 MG tablet Take 1 tablet (50 mg total) by mouth every 6 (six) hours as needed for moderate pain. 01/22/17   Joni Reining, PA-C    Allergies Patient has no known allergies.  Family History  Problem Relation Age of Onset  . Stroke Mother   . Diabetes Maternal Grandmother   . Heart disease Maternal Grandmother     Social History Social History  Substance Use Topics  . Smoking status: Former Smoker    Packs/day: 0.25    Years: 4.00    Types: Cigarettes    Quit date: 04/25/2010  . Smokeless tobacco: Never Used  . Alcohol use Yes     Comment: OCC    Review of Systems Constitutional: No fever/chills Eyes: No visual changes. ENT: No sore throat. Dental pain Cardiovascular: Denies chest pain. Respiratory: Denies shortness of breath. Gastrointestinal: No abdominal pain.  No nausea, no vomiting.  No diarrhea.  No constipation. Genitourinary: Negative for dysuria. Musculoskeletal: Negative for back pain. Skin: Negative for rash. Neurological: Negative for headaches, focal weakness or numbness.   ____________________________________________   PHYSICAL EXAM:  VITAL SIGNS: ED Triage Vitals  Enc Vitals Group     BP 01/22/17 2105 (!) 143/105     Pulse Rate  01/22/17 2105 (!) 104     Resp 01/22/17 2105 18     Temp 01/22/17 2105 98.7 F (37.1 C)     Temp Source 01/22/17 2105 Oral     SpO2 01/22/17 2105 100 %     Weight 01/22/17 2105 150 lb (68 kg)     Height 01/22/17 2105 5' (1.524 m)     Head Circumference --      Peak Flow --      Pain Score 01/22/17 2104 10     Pain Loc --      Pain Edu? --      Excl. in GC? --    Constitutional: Alert and oriented. Well appearing and in no acute distress. Mouth/Throat:  Mucous membranes are moist.  Oropharynx non-erythematous. Partial filling tooth #19 with mild gingival edema. Hematological/Lymphatic/Immunilogical: No cervical lymphadenopathy. Cardiovascular: Normal rate, regular rhythm. Grossly normal heart sounds.  Good peripheral circulation. Elevated blood pressure Respiratory: Normal respiratory effort.  No retractions. Lungs CTAB. Neurologic:  Normal speech and language. No gross focal neurologic deficits are appreciated. No gait instability. Skin:  Skin is warm, dry and intact. No rash noted. Psychiatric: Mood and affect are normal. Speech and behavior are normal.  ____________________________________________   LABS (all labs ordered are listed, but only abnormal results are displayed)  Labs Reviewed - No data to display ____________________________________________  EKG   ____________________________________________  RADIOLOGY  No results found.  ____________________________________________   PROCEDURES  Procedure(s) performed: None  Procedures  Critical Care performed: No  ____________________________________________   INITIAL IMPRESSION / ASSESSMENT AND PLAN / ED COURSE  As part of my medical decision making, I reviewed the following data within the electronic MEDICAL RECORD NUMBER    Dental pain secondary to caries. Patient given discharge care instructions and advised to contact the [facility] for an appointment. Advised to take medication as directed.      ____________________________________________   FINAL CLINICAL IMPRESSION(S) / ED DIAGNOSES  Final diagnoses:  Pain due to dental caries      NEW MEDICATIONS STARTED DURING THIS VISIT:  New Prescriptions   AMOXICILLIN (AMOXIL) 500 MG CAPSULE    Take 1 capsule (500 mg total) by mouth 3 (three) times daily.   IBUPROFEN (ADVIL,MOTRIN) 600 MG TABLET    Take 1 tablet (600 mg total) by mouth every 8 (eight) hours as needed.   LIDOCAINE (XYLOCAINE) 2 %  SOLUTION    Use as directed 5 mLs in the mouth or throat every 6 (six) hours as needed for mouth pain.   TRAMADOL (ULTRAM) 50 MG TABLET    Take 1 tablet (50 mg total) by mouth every 6 (six) hours as needed for moderate pain.     Note:  This document was prepared using Dragon voice recognition software and may include unintentional dictation errors.    Joni Reining, PA-C 01/22/17 2145    Sharman Cheek, MD 01/22/17 2308

## 2017-10-06 ENCOUNTER — Emergency Department
Admission: EM | Admit: 2017-10-06 | Discharge: 2017-10-06 | Disposition: A | Discharge status: Home / Self Care | Payer: Self-pay | Attending: Emergency Medicine | Admitting: Emergency Medicine

## 2017-10-06 ENCOUNTER — Encounter: Payer: Self-pay | Admitting: Emergency Medicine

## 2017-10-06 DIAGNOSIS — B9689 Other specified bacterial agents as the cause of diseases classified elsewhere: Secondary | ICD-10-CM

## 2017-10-06 DIAGNOSIS — Y998 Other external cause status: Secondary | ICD-10-CM | POA: Insufficient documentation

## 2017-10-06 DIAGNOSIS — T192XXA Foreign body in vulva and vagina, initial encounter: Secondary | ICD-10-CM | POA: Insufficient documentation

## 2017-10-06 DIAGNOSIS — Y9389 Activity, other specified: Secondary | ICD-10-CM | POA: Insufficient documentation

## 2017-10-06 DIAGNOSIS — N760 Acute vaginitis: Secondary | ICD-10-CM | POA: Insufficient documentation

## 2017-10-06 DIAGNOSIS — Y929 Unspecified place or not applicable: Secondary | ICD-10-CM | POA: Insufficient documentation

## 2017-10-06 DIAGNOSIS — Z87891 Personal history of nicotine dependence: Secondary | ICD-10-CM | POA: Insufficient documentation

## 2017-10-06 DIAGNOSIS — X58XXXA Exposure to other specified factors, initial encounter: Secondary | ICD-10-CM | POA: Insufficient documentation

## 2017-10-06 LAB — URINALYSIS, COMPLETE (UACMP) WITH MICROSCOPIC
BILIRUBIN URINE: NEGATIVE
Glucose, UA: NEGATIVE mg/dL
Hgb urine dipstick: NEGATIVE
KETONES UR: NEGATIVE mg/dL
LEUKOCYTES UA: NEGATIVE
Nitrite: NEGATIVE
Protein, ur: NEGATIVE mg/dL
SPECIFIC GRAVITY, URINE: 1.019 (ref 1.005–1.030)
pH: 5 (ref 5.0–8.0)

## 2017-10-06 LAB — WET PREP, GENITAL
Sperm: NONE SEEN
Trich, Wet Prep: NONE SEEN
YEAST WET PREP: NONE SEEN

## 2017-10-06 LAB — POC URINE PREG, ED: PREG TEST UR: NEGATIVE

## 2017-10-06 MED ORDER — METRONIDAZOLE 500 MG PO TABS: 500.0000 mg | ORAL_TABLET | Freq: Three times a day (TID) | ORAL | 0 refills | Status: DC

## 2017-10-06 NOTE — ED Provider Notes (Signed)
[facility] Emergency Department Provider Note  ____________________________________________   First MD Initiated Contact with Patient 10/06/17 1315     (approximate)  I have reviewed the triage vital signs and the nursing notes.   HISTORY  Chief Complaint Vaginal Discharge    HPI Stefanie Mason is a 32 y.o. female's emergency department stating that she has a foul odor from her vagina.  She states she got off her menstrual cycle a few days ago and started to notice the odor.  Patient states that she did have intercourse last night.  She did not notice any new symptoms.  She denies any abdominal pain, vomiting, nausea or diarrhea.    Past Medical History:  Diagnosis Date  . Anemia    H/O  . Heart murmur    ASYMPTOMATIC  . Pregnancy induced hypertension     Patient Active Problem List   Diagnosis Date Noted  . Postpartum care following vaginal delivery 01/14/2016  . Gestational hypertension 01/11/2016  . Labor and delivery, indication for care 10/24/2015    Past Surgical History:  Procedure Laterality Date  . LAPAROSCOPIC TUBAL LIGATION Bilateral 04/29/2016   Procedure: LAPAROSCOPIC TUBAL LIGATION;  Surgeon: Vena AustriaAndreas Staebler, MD;  Location: [facility] ORS;  Service: Gynecology;  Laterality: Bilateral;  . NO PAST SURGERIES      Prior to Admission medications   Medication Sig Start Date End Date Taking? Authorizing Provider  metroNIDAZOLE (FLAGYL) 500 MG tablet Take 1 tablet (500 mg total) by mouth 3 (three) times daily. 10/06/17   Faythe GheeFisher, Jaeline Whobrey W, PA-C    Allergies Patient has no known allergies.  Family History  Problem Relation Age of Onset  . Stroke Mother   . Diabetes Maternal Grandmother   . Heart disease Maternal Grandmother     Social History Social History   Tobacco Use  . Smoking status: Former Smoker    Packs/day: 0.25    Years: 4.00    Pack years: 1.00    Types: Cigarettes    Last attempt to quit: 04/25/2010    Years  since quitting: 7.4  . Smokeless tobacco: Never Used  Substance Use Topics  . Alcohol use: Yes    Comment: OCC  . Drug use: No    Review of Systems  Constitutional: No fever/chills Eyes: No visual changes. ENT: No sore throat. Respiratory: Denies cough Genitourinary: Negative for dysuria.  Positive for vaginal odor, denies discharge Musculoskeletal: Negative for back pain. Skin: Negative for rash.    ____________________________________________   PHYSICAL EXAM:  VITAL SIGNS: ED Triage Vitals  Enc Vitals Group     BP 10/06/17 1250 119/81     Pulse Rate 10/06/17 1250 83     Resp 10/06/17 1250 20     Temp 10/06/17 1250 98.4 F (36.9 C)     Temp Source 10/06/17 1250 Oral     SpO2 10/06/17 1250 100 %     Weight 10/06/17 1250 151 lb (68.5 kg)     Height 10/06/17 1250 5' (1.524 m)     Head Circumference --      Peak Flow --      Pain Score 10/06/17 1249 0     Pain Loc --      Pain Edu? --      Excl. in GC? --     Constitutional: Alert and oriented. Well appearing and in no acute distress. Eyes: Conjunctivae are normal.  Head: Atraumatic. Nose: No congestion/rhinnorhea. Mouth/Throat: Mucous membranes are moist.   Cardiovascular: Normal  rate, regular rhythm. Respiratory: Normal respiratory effort.  No retractions Abdomen: Soft nontender GU: Pelvic exam shows a foreign body in the vagina with a foul odor.  After the foreign bodies removed is noted to be a tampon.  Swab for wet prep was performed. Musculoskeletal: FROM all extremities, warm and well perfused Neurologic:  Normal speech and language.  Skin:  Skin is warm, dry and intact. No rash noted. Psychiatric: Mood and affect are normal. Speech and behavior are normal.  ____________________________________________   LABS (all labs ordered are listed, but only abnormal results are displayed)  Labs Reviewed  WET PREP, GENITAL - Abnormal; Notable for the following components:      Result Value   Clue Cells Wet  Prep HPF POC PRESENT (*)    WBC, Wet Prep HPF POC RARE (*)    All other components within normal limits  URINALYSIS, COMPLETE (UACMP) WITH MICROSCOPIC - Abnormal; Notable for the following components:   Color, Urine YELLOW (*)    APPearance HAZY (*)    Bacteria, UA RARE (*)    All other components within normal limits  POC URINE PREG, ED   ____________________________________________   ____________________________________________  RADIOLOGY    ____________________________________________   PROCEDURES  Procedure(s) performed: No  Procedures    ____________________________________________   INITIAL IMPRESSION / ASSESSMENT AND PLAN / ED COURSE  Pertinent labs & imaging results that were available during my care of the patient were reviewed by me and considered in my medical decision making (see chart for details).  Patient is 32 year old female presents emergency department complaining of a foul odor from her vagina.  She denies any vaginal discharge.  She finished her menstrual cycle few days ago.  On physical exam there is a foreign body in the vagina.  The tampon was removed.  Swabs were obtained for wet prep.  Wet prep shows clue cells and white blood cells.  Test results were discussed with the patient.  She was given a prescription for Flagyl 500 mg 3 times daily for 7 days.  She is follow-up with her GYN doctor in acute care if she is not better in 3 to 5 days.  She is worsening she is to return to emergency department.  She states she understands will comply with our instructions.  She is discharged in stable condition     As part of my medical decision making, I reviewed the following data within the electronic MEDICAL RECORD NUMBER Nursing notes reviewed and incorporated, Labs reviewed wet prep shows white blood cells and clue cells, Old chart reviewed, Notes from prior ED visits and [place] Controlled Substance  Database  ____________________________________________   FINAL CLINICAL IMPRESSION(S) / ED DIAGNOSES  Final diagnoses:  BV (bacterial vaginosis)  Foreign body in vagina, initial encounter      NEW MEDICATIONS STARTED DURING THIS VISIT:  New Prescriptions   METRONIDAZOLE (FLAGYL) 500 MG TABLET    Take 1 tablet (500 mg total) by mouth 3 (three) times daily.     Note:  This document was prepared using Dragon voice recognition software and may include unintentional dictation errors.    Faythe Ghee, PA-C 10/06/17 1554    Nita Sickle, MD 10/08/17 2255

## 2017-10-06 NOTE — Discharge Instructions (Addendum)
Follow-up with your regular doctor if not better in 3 to 5 days.  Use medication as prescribed. °

## 2017-10-06 NOTE — ED Triage Notes (Signed)
Pt reports got off her menstrual cycle a few days ago and now with foul odor.

## 2017-10-06 NOTE — ED Notes (Signed)
RN called lab to request an update. Lab not running test yet but reports they will start test at this time. Pt updated on delay.

## 2017-10-06 NOTE — ED Notes (Signed)
First Nurse Note: Pt c/o vaginal discharge and odor. Pt in NAD.

## 2017-12-25 ENCOUNTER — Other Ambulatory Visit: Payer: Self-pay

## 2017-12-25 ENCOUNTER — Encounter: Payer: Self-pay | Admitting: Emergency Medicine

## 2017-12-25 ENCOUNTER — Emergency Department
Admission: EM | Admit: 2017-12-25 | Discharge: 2017-12-25 | Disposition: A | Discharge status: Home / Self Care | Payer: Self-pay | Attending: Emergency Medicine | Admitting: Emergency Medicine

## 2017-12-25 DIAGNOSIS — Y9389 Activity, other specified: Secondary | ICD-10-CM | POA: Insufficient documentation

## 2017-12-25 DIAGNOSIS — Z87891 Personal history of nicotine dependence: Secondary | ICD-10-CM | POA: Insufficient documentation

## 2017-12-25 DIAGNOSIS — Y999 Unspecified external cause status: Secondary | ICD-10-CM | POA: Insufficient documentation

## 2017-12-25 DIAGNOSIS — Y92410 Unspecified street and highway as the place of occurrence of the external cause: Secondary | ICD-10-CM | POA: Insufficient documentation

## 2017-12-25 DIAGNOSIS — T07XXXA Unspecified multiple injuries, initial encounter: Secondary | ICD-10-CM | POA: Insufficient documentation

## 2017-12-25 DIAGNOSIS — M791 Myalgia, unspecified site: Secondary | ICD-10-CM | POA: Insufficient documentation

## 2017-12-25 DIAGNOSIS — M7918 Myalgia, other site: Secondary | ICD-10-CM

## 2017-12-25 LAB — POCT PREGNANCY, URINE: Preg Test, Ur: NEGATIVE

## 2017-12-25 MED ORDER — IBUPROFEN 800 MG PO TABS
800.0000 mg | ORAL_TABLET | Freq: Once | ORAL | Status: AC
  Administered 2017-12-25: 800 mg via ORAL
  Filled 2017-12-25: qty 1
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
## 2018-04-16 ENCOUNTER — Emergency Department
Admission: EM | Admit: 2018-04-16 | Discharge: 2018-04-16 | Disposition: A | Discharge status: Home / Self Care | Payer: BLUE CROSS/BLUE SHIELD | Attending: Student in an Organized Health Care Education/Training Program | Admitting: Student in an Organized Health Care Education/Training Program

## 2018-04-16 ENCOUNTER — Other Ambulatory Visit: Payer: Self-pay

## 2018-04-16 ENCOUNTER — Encounter: Payer: Self-pay | Admitting: Emergency Medicine

## 2018-04-16 DIAGNOSIS — S60571A Other superficial bite of hand of right hand, initial encounter: Secondary | ICD-10-CM | POA: Diagnosis present

## 2018-04-16 DIAGNOSIS — Z23 Encounter for immunization: Secondary | ICD-10-CM | POA: Diagnosis not present

## 2018-04-16 DIAGNOSIS — Y9389 Activity, other specified: Secondary | ICD-10-CM | POA: Diagnosis not present

## 2018-04-16 DIAGNOSIS — Z87891 Personal history of nicotine dependence: Secondary | ICD-10-CM | POA: Diagnosis not present

## 2018-04-16 DIAGNOSIS — Y998 Other external cause status: Secondary | ICD-10-CM | POA: Diagnosis not present

## 2018-04-16 DIAGNOSIS — Y92009 Unspecified place in unspecified non-institutional (private) residence as the place of occurrence of the external cause: Secondary | ICD-10-CM | POA: Diagnosis not present

## 2018-04-16 DIAGNOSIS — W5501XA Bitten by cat, initial encounter: Secondary | ICD-10-CM | POA: Diagnosis not present

## 2018-04-16 MED ORDER — TETANUS-DIPHTH-ACELL PERTUSSIS 5-2.5-18.5 LF-MCG/0.5 IM SUSP
0.5000 mL | Freq: Once | INTRAMUSCULAR | Status: AC
  Administered 2018-04-16: 0.5 mL via INTRAMUSCULAR
  Filled 2018-04-16: qty 0.5

## 2018-04-16 MED ORDER — AMOXICILLIN-POT CLAVULANATE 875-125 MG PO TABS: 1.0000 | ORAL_TABLET | Freq: Two times a day (BID) | ORAL | 0 refills | Status: AC

## 2018-04-16 NOTE — ED Provider Notes (Signed)
[facility] Emergency Department Provider Note   ____________________________________________   First MD Initiated Contact with Patient 04/16/18 1237     (approximate)  I have reviewed the triage vital signs and the nursing notes.   HISTORY  Chief Complaint Animal Bite    HPI Stefanie Mason is a 32 y.o. female patient presents with cat bite to the right hand.  Patient that she was bit at home while trying to capture cat.  Patient state cat is now with animal control.  Patient tetanus shot is not up-to-date.   Past Medical History:  Diagnosis Date  . Anemia    H/O  . Heart murmur    ASYMPTOMATIC  . Pregnancy induced hypertension     Patient Active Problem List   Diagnosis Date Noted  . Postpartum care following vaginal delivery 01/14/2016  . Gestational hypertension 01/11/2016  . Labor and delivery, indication for care 10/24/2015    Past Surgical History:  Procedure Laterality Date  . LAPAROSCOPIC TUBAL LIGATION Bilateral 04/29/2016   Procedure: LAPAROSCOPIC TUBAL LIGATION;  Surgeon: Vena AustriaAndreas Staebler, MD;  Location: [facility] ORS;  Service: Gynecology;  Laterality: Bilateral;  . NO PAST SURGERIES      Prior to Admission medications   Medication Sig Start Date End Date Taking? Authorizing Provider  amoxicillin-clavulanate (AUGMENTIN) 875-125 MG tablet Take 1 tablet by mouth 2 (two) times daily for 10 days. 04/16/18 04/26/18  Joni ReiningSmith, Ronald K, PA-C  metroNIDAZOLE (FLAGYL) 500 MG tablet Take 1 tablet (500 mg total) by mouth 3 (three) times daily. 10/06/17   Faythe GheeFisher, Susan W, PA-C    Allergies Patient has no known allergies.  Family History  Problem Relation Age of Onset  . Stroke Mother   . Diabetes Maternal Grandmother   . Heart disease Maternal Grandmother     Social History Social History   Tobacco Use  . Smoking status: Former Smoker    Packs/day: 0.25    Years: 4.00    Pack years: 1.00    Types: Cigarettes    Last attempt to quit:  04/25/2010    Years since quitting: 7.9  . Smokeless tobacco: Never Used  Substance Use Topics  . Alcohol use: Yes    Comment: occasional   . Drug use: No    Review of Systems Constitutional: No fever/chills Eyes: No visual changes. ENT: No sore throat. Cardiovascular: Denies chest pain. Respiratory: Denies shortness of breath. Gastrointestinal: No abdominal pain.  No nausea, no vomiting.  No diarrhea.  No constipation. Genitourinary: Negative for dysuria. Musculoskeletal: Negative for back pain. Skin: Negative for rash. Neurological: Negative for headaches, focal weakness or numbness.   ____________________________________________   PHYSICAL EXAM:  VITAL SIGNS: ED Triage Vitals  Enc Vitals Group     BP 04/16/18 1229 129/83     Pulse Rate 04/16/18 1229 82     Resp --      Temp 04/16/18 1229 98.2 F (36.8 C)     Temp Source 04/16/18 1229 Oral     SpO2 04/16/18 1229 99 %     Weight 04/16/18 1227 145 lb (65.8 kg)     Height 04/16/18 1227 5' (1.524 m)     Head Circumference --      Peak Flow --      Pain Score 04/16/18 1226 4     Pain Loc --      Pain Edu? --      Excl. in GC? --    Constitutional: Alert and oriented. Well appearing and  in no acute distress. Neck: No stridor.  Cardiovascular: Normal rate, regular rhythm. Grossly normal heart sounds.  Good peripheral circulation. Respiratory: Normal respiratory effort.  No retractions. Lungs CTAB. Skin: Bite marks on second digit right hand and also on the palmar aspect of the first metacarpal.   Psychiatric: Mood and affect are normal. Speech and behavior are normal.  ____________________________________________   LABS (all labs ordered are listed, but only abnormal results are displayed)  Labs Reviewed - No data to display ____________________________________________  EKG   ____________________________________________  RADIOLOGY  ED MD interpretation:    Official radiology report(s): No results  found.  ____________________________________________   PROCEDURES  Procedure(s) performed: None  Procedures  Critical Care performed: No  ____________________________________________   INITIAL IMPRESSION / ASSESSMENT AND PLAN / ED COURSE  As part of my medical decision making, I reviewed the following data within the electronic MEDICAL RECORD NUMBER    Cat bite to the right hand.  Discussed patient rationale for not closing wounds.  Patient given discharge care instruction.  Wounds were cleaned and bandaged and patient given prescription for Augmentin.  Patient advised to follow-up animal control for rabies status of care.      ____________________________________________   FINAL CLINICAL IMPRESSION(S) / ED DIAGNOSES  Final diagnoses:  Cat bite, initial encounter     ED Discharge Orders         Ordered    amoxicillin-clavulanate (AUGMENTIN) 875-125 MG tablet  2 times daily     04/16/18 1333           Note:  This document was prepared using Dragon voice recognition software and may include unintentional dictation errors.    Joni ReiningSmith, Ronald K, PA-C 04/16/18 1351    Willy Eddyobinson, Patrick, MD 04/16/18 [phone]

## 2018-04-16 NOTE — Discharge Instructions (Signed)
Follow-up with animal control to 10 days for rabies status.  Take antibiotics as directed.

## 2018-04-16 NOTE — ED Triage Notes (Signed)
Pt presents to ed via pov with c/o animal bite. Pt states stray cats at her home bit her on the right hand. Two small abrasions noted to right hand. No bleeding noted to site. Pt in NAD at this time.

## 2018-04-18 HISTORY — PX: CERVICAL BIOPSY  W/ LOOP ELECTRODE EXCISION: SUR135

## 2018-07-12 ENCOUNTER — Encounter: Payer: Self-pay | Admitting: Emergency Medicine

## 2018-07-12 ENCOUNTER — Emergency Department: Payer: BLUE CROSS/BLUE SHIELD

## 2018-07-12 ENCOUNTER — Emergency Department
Admission: EM | Admit: 2018-07-12 | Discharge: 2018-07-12 | Disposition: A | Discharge status: Home / Self Care | Payer: BLUE CROSS/BLUE SHIELD | Attending: Student in an Organized Health Care Education/Training Program | Admitting: Student in an Organized Health Care Education/Training Program

## 2018-07-12 ENCOUNTER — Other Ambulatory Visit: Payer: Self-pay

## 2018-07-12 DIAGNOSIS — S0993XA Unspecified injury of face, initial encounter: Secondary | ICD-10-CM

## 2018-07-12 DIAGNOSIS — Y9374 Activity, frisbee: Secondary | ICD-10-CM | POA: Insufficient documentation

## 2018-07-12 DIAGNOSIS — Z87891 Personal history of nicotine dependence: Secondary | ICD-10-CM | POA: Insufficient documentation

## 2018-07-12 DIAGNOSIS — S0992XA Unspecified injury of nose, initial encounter: Secondary | ICD-10-CM | POA: Diagnosis present

## 2018-07-12 DIAGNOSIS — Y999 Unspecified external cause status: Secondary | ICD-10-CM | POA: Diagnosis not present

## 2018-07-12 DIAGNOSIS — Y929 Unspecified place or not applicable: Secondary | ICD-10-CM | POA: Diagnosis not present

## 2018-07-12 DIAGNOSIS — W2189XA Striking against or struck by other sports equipment, initial encounter: Secondary | ICD-10-CM | POA: Insufficient documentation

## 2018-07-12 DIAGNOSIS — S0083XA Contusion of other part of head, initial encounter: Secondary | ICD-10-CM | POA: Diagnosis not present

## 2018-07-12 MED ORDER — IBUPROFEN 600 MG PO TABS: 600.0000 mg | ORAL_TABLET | Freq: Four times a day (QID) | ORAL | 0 refills | Status: DC | PRN
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
## 2018-07-23 ENCOUNTER — Other Ambulatory Visit: Payer: Self-pay

## 2018-07-23 ENCOUNTER — Emergency Department
Admission: EM | Admit: 2018-07-23 | Discharge: 2018-07-23 | Disposition: A | Discharge status: Home / Self Care | Payer: BLUE CROSS/BLUE SHIELD | Attending: Emergency Medicine | Admitting: Emergency Medicine

## 2018-07-23 ENCOUNTER — Emergency Department: Payer: BLUE CROSS/BLUE SHIELD

## 2018-07-23 ENCOUNTER — Encounter: Payer: Self-pay | Admitting: Emergency Medicine

## 2018-07-23 DIAGNOSIS — Z87891 Personal history of nicotine dependence: Secondary | ICD-10-CM | POA: Insufficient documentation

## 2018-07-23 DIAGNOSIS — J069 Acute upper respiratory infection, unspecified: Secondary | ICD-10-CM | POA: Insufficient documentation

## 2018-07-23 DIAGNOSIS — R05 Cough: Secondary | ICD-10-CM | POA: Diagnosis present

## 2018-07-23 NOTE — ED Notes (Signed)
First Nurse Note: Patient coughing, given adult face mask to wear.

## 2018-07-23 NOTE — ED Provider Notes (Signed)
[facility] Emergency Department Provider Note   ____________________________________________    I have reviewed the triage vital signs and the nursing notes.   HISTORY  Chief Complaint Cough and Fever     HPI Stefanie Mason is a 33 y.o. female who presents with cough and fever.  Patient reports she was sent home from work today after having a temperature of 100.3.  She reports she has had a dry cough over the last 3 days which she has attributed to allergies activated after mowing her lawn.  She denies body aches.  Currently feels well denies shortness of breath.  No recent travel.  No exposure to novel coronavirus reported  Past Medical History:  Diagnosis Date  . Anemia    H/O  . Heart murmur    ASYMPTOMATIC  . Pregnancy induced hypertension     Patient Active Problem List   Diagnosis Date Noted  . Postpartum care following vaginal delivery 01/14/2016  . Gestational hypertension 01/11/2016  . Labor and delivery, indication for care 10/24/2015    Past Surgical History:  Procedure Laterality Date  . LAPAROSCOPIC TUBAL LIGATION Bilateral 04/29/2016   Procedure: LAPAROSCOPIC TUBAL LIGATION;  Surgeon: Vena AustriaAndreas Staebler, MD;  Location: [facility] ORS;  Service: Gynecology;  Laterality: Bilateral;  . NO PAST SURGERIES      Prior to Admission medications   Medication Sig Start Date End Date Taking? Authorizing Provider  ibuprofen (ADVIL,MOTRIN) 600 MG tablet Take 1 tablet (600 mg total) by mouth every 6 (six) hours as needed. 07/12/18   Enid DerryWagner, Ashley, PA-C     Allergies Patient has no known allergies.  Family History  Problem Relation Age of Onset  . Stroke Mother   . Diabetes Maternal Grandmother   . Heart disease Maternal Grandmother     Social History Social History   Tobacco Use  . Smoking status: Former Smoker    Packs/day: 0.25    Years: 4.00    Pack years: 1.00    Types: Cigarettes    Last attempt to quit: 04/25/2010    Years  since quitting: 8.2  . Smokeless tobacco: Never Used  Substance Use Topics  . Alcohol use: Yes    Comment: occasional   . Drug use: No    Review of Systems  Constitutional: As above  ENT: No sore throat.  Nose   Gastrointestinal: No abdominal pain.  No nausea, no vomiting.    Musculoskeletal: Negative for myalgias Skin: Negative for rash. Neurological: Negative for headaches     ____________________________________________   PHYSICAL EXAM:  VITAL SIGNS: ED Triage Vitals  Enc Vitals Group     BP 07/23/18 0827 (!) 140/94     Pulse Rate 07/23/18 0827 (!) 107     Resp 07/23/18 0827 16     Temp 07/23/18 0827 98.1 F (36.7 C)     Temp Source 07/23/18 0827 Oral     SpO2 07/23/18 0827 100 %     Weight 07/23/18 0827 68 kg (150 lb)     Height 07/23/18 0827 1.524 m (5')     Head Circumference --      Peak Flow --      Pain Score 07/23/18 0826 2     Pain Loc --      Pain Edu? --      Excl. in GC? --     Constitutional: Alert and oriented. No acute distress. Pleasant and interactive Eyes: Conjunctivae are normal.  Head: Atraumatic. Nose: No congestion/rhinnorhea. Mouth/Throat:  Mucous membranes are moist.   Cardiovascular: Normal rate, regular rhythm.  Respiratory: Normal respiratory effort.  No retractions.  Musculoskeletal: No lower extremity tenderness nor edema.   Neurologic:  Normal speech and language. No gross focal neurologic deficits are appreciated.   Skin:  Skin is warm, dry and intact. No rash noted.   ____________________________________________   LABS (all labs ordered are listed, but only abnormal results are displayed)  Labs Reviewed - No data to display ____________________________________________  EKG   ____________________________________________  RADIOLOGY  X-ray unremarkable ____________________________________________   PROCEDURES  Procedure(s) performed: No  Procedures   Critical Care performed: No  ____________________________________________   INITIAL IMPRESSION / ASSESSMENT AND PLAN / ED COURSE  Pertinent labs & imaging results that were available during my care of the patient were reviewed by me and considered in my medical decision making (see chart for details).  Patient presents with symptoms consistent with viral upper respiratory infection, differential certainly includes novel coronavirus.  We discussed strict quarantine and return to work guidelines as well as return precautions   Stefanie Mason was evaluated in Emergency Department on 07/23/2018 for the symptoms described in the history of present illness. She was evaluated in the context of the global COVID-19 pandemic, which necessitated consideration that the patient might be at risk for infection with the SARS-CoV-2 virus that causes COVID-19. Institutional protocols and algorithms that pertain to the evaluation of patients at risk for COVID-19 are in a state of rapid change based on information released by regulatory bodies including the CDC and federal and state organizations. These policies and algorithms were followed during the patient's care in the ED.    ____________________________________________   FINAL CLINICAL IMPRESSION(S) / ED DIAGNOSES  Final diagnoses:  Viral upper respiratory tract infection      NEW MEDICATIONS STARTED DURING THIS VISIT:  New Prescriptions   No medications on file     Note:  This document was prepared using Dragon voice recognition software and may include unintentional dictation errors.   Jene Every, MD 07/23/18 [phone]

## 2018-07-23 NOTE — ED Triage Notes (Signed)
Cough for several days  Developed fever today

## 2018-07-23 NOTE — ED Notes (Addendum)
Pt states her work took her temp and it was 100.3 and then 30 minutes later it was 98.1- c/o cough, runny nose, sore throat- pt states this started after mowing the lawn and thinks its just allergies- denies travel and exposure to covid

## 2018-10-22 ENCOUNTER — Encounter: Payer: Self-pay | Admitting: Emergency Medicine

## 2018-10-22 ENCOUNTER — Other Ambulatory Visit: Payer: Self-pay

## 2018-10-22 ENCOUNTER — Emergency Department
Admission: EM | Admit: 2018-10-22 | Discharge: 2018-10-22 | Disposition: A | Discharge status: Home / Self Care | Payer: BC Managed Care – PPO | Attending: Emergency Medicine | Admitting: Emergency Medicine

## 2018-10-22 DIAGNOSIS — Z87891 Personal history of nicotine dependence: Secondary | ICD-10-CM | POA: Insufficient documentation

## 2018-10-22 DIAGNOSIS — R109 Unspecified abdominal pain: Secondary | ICD-10-CM | POA: Insufficient documentation

## 2018-10-22 LAB — URINALYSIS, COMPLETE (UACMP) WITH MICROSCOPIC
Bacteria, UA: NONE SEEN
Bilirubin Urine: NEGATIVE
Glucose, UA: NEGATIVE mg/dL
Hgb urine dipstick: NEGATIVE
Ketones, ur: NEGATIVE mg/dL
Nitrite: NEGATIVE
Protein, ur: NEGATIVE mg/dL
Specific Gravity, Urine: 1.024 (ref 1.005–1.030)
pH: 6 (ref 5.0–8.0)

## 2018-10-22 LAB — POCT PREGNANCY, URINE: Preg Test, Ur: NEGATIVE

## 2018-10-22 MED ORDER — CEPHALEXIN 500 MG PO CAPS: 500.0000 mg | ORAL_CAPSULE | Freq: Two times a day (BID) | ORAL | 0 refills | Status: DC

## 2018-10-22 NOTE — ED Provider Notes (Signed)
[facility] Emergency Department Provider Note   ____________________________________________    I have reviewed the triage vital signs and the nursing notes.   HISTORY  Chief Complaint Abdominal Cramping     HPI Stefanie Mason is a 33 y.o. female who presents with abdominal cramping, intermittently.  Patient reports symptoms developed 2 to 3 days ago, she has not had her menstrual cycle in over a month, she does report that her periods have become irregular recently.  She has had a tubal ligation.  She is concerned she may be pregnant.  Denies vaginal discharge.  No significant dysuria or frequency.  No nausea vomiting.  Has not take anything for this.  Past Medical History:  Diagnosis Date  . Anemia    H/O  . Heart murmur    ASYMPTOMATIC  . Pregnancy induced hypertension     Patient Active Problem List   Diagnosis Date Noted  . Postpartum care following vaginal delivery 01/14/2016  . Gestational hypertension 01/11/2016  . Labor and delivery, indication for care 10/24/2015    Past Surgical History:  Procedure Laterality Date  . LAPAROSCOPIC TUBAL LIGATION Bilateral 04/29/2016   Procedure: LAPAROSCOPIC TUBAL LIGATION;  Surgeon: Malachy Mood, MD;  Location: [facility] ORS;  Service: Gynecology;  Laterality: Bilateral;  . NO PAST SURGERIES      Prior to Admission medications   Medication Sig Start Date End Date Taking? Authorizing Provider  ibuprofen (ADVIL) 200 MG tablet Take 400-800 mg by mouth every 6 (six) hours as needed for fever, mild pain or cramping.   Yes [provider]  cephALEXin (KEFLEX) 500 MG capsule Take 1 capsule (500 mg total) by mouth 2 (two) times daily. 10/22/18   Lavonia Drafts, MD  ibuprofen (ADVIL,MOTRIN) 600 MG tablet Take 1 tablet (600 mg total) by mouth every 6 (six) hours as needed. Patient not taking: Reported on 10/22/2018 07/12/18   Laban Emperor, PA-C     Allergies Codeine  Family History  Problem  Relation Age of Onset  . Stroke Mother   . Diabetes Maternal Grandmother   . Heart disease Maternal Grandmother     Social History Social History   Tobacco Use  . Smoking status: Former Smoker    Packs/day: 0.25    Years: 4.00    Pack years: 1.00    Types: Cigarettes    Quit date: 04/25/2010    Years since quitting: 8.4  . Smokeless tobacco: Never Used  Substance Use Topics  . Alcohol use: Yes    Comment: occasional   . Drug use: No    Review of Systems  Constitutional: No fever/chills Eyes: No visual changes.  ENT: No sore throat. Cardiovascular: Denies chest pain. Respiratory: Denies shortness of breath. Gastrointestinal: As above Genitourinary: As above Musculoskeletal: Negative for back pain. Skin: Negative for rash. Neurological: Negative for headaches or weakness   ____________________________________________   PHYSICAL EXAM:  VITAL SIGNS: ED Triage Vitals  Enc Vitals Group     BP 10/22/18 0726 130/88     Pulse Rate 10/22/18 0726 81     Resp 10/22/18 0726 18     Temp 10/22/18 0726 98.5 F (36.9 C)     Temp Source 10/22/18 0726 Oral     SpO2 10/22/18 0726 100 %     Weight 10/22/18 0727 65.8 kg (145 lb)     Height 10/22/18 0727 1.524 m (5')     Head Circumference --      Peak Flow --  Pain Score 10/22/18 0749 5     Pain Loc --      Pain Edu? --      Excl. in GC? --     Constitutional: Alert and oriented. No acute distress.  Nose: No congestion/rhinnorhea. Mouth/Throat: Mucous membranes are moist.    Cardiovascular: Normal rate, regular rhythm.   Good peripheral circulation. Respiratory: Normal respiratory effort.  No retractions. Lungs CTAB. Gastrointestinal: Soft and nontender. No distention.  No CVA tenderness. Genitourinary: deferred Musculoskeletal: .  Warm and well perfused Neurologic:  Normal speech and language. No gross focal neurologic deficits are appreciated.  Skin:  Skin is warm, dry and intact. No rash noted. Psychiatric:  Mood and affect are normal. Speech and behavior are normal.  ____________________________________________   LABS (all labs ordered are listed, but only abnormal results are displayed)  Labs Reviewed  URINALYSIS, COMPLETE (UACMP) WITH MICROSCOPIC - Abnormal; Notable for the following components:      Result Value   Color, Urine YELLOW (*)    APPearance CLOUDY (*)    Leukocytes,Ua LARGE (*)    All other components within normal limits  POCT PREGNANCY, URINE   ____________________________________________  EKG  None ____________________________________________  RADIOLOGY  None ____________________________________________   PROCEDURES  Procedure(s) performed: No  Procedures   Critical Care performed:No ____________________________________________   INITIAL IMPRESSION / ASSESSMENT AND PLAN / ED COURSE  Pertinent labs & imaging results that were available during my care of the patient were reviewed by me and considered in my medical decision making (see chart for details).  Patient well-appearing and in no acute distress.  Reassuring abdominal exam.  She is reassured by negative pregnancy test, urinalysis contaminated, will Rx prescription for UTI in case she develops any symptoms.    ____________________________________________   FINAL CLINICAL IMPRESSION(S) / ED DIAGNOSES  Final diagnoses:  Abdominal cramping        Note:  This document was prepared using Dragon voice recognition software and may include unintentional dictation errors.   Jene EveryKinner, Rainn Zupko, MD 10/22/18 [phone]

## 2018-10-22 NOTE — ED Notes (Addendum)
Pt alert and oriented X4, active, cooperative, pt in NAD. RR even and unlabored, color WNL.  Pt informed to return if any life threatening symptoms occur.  Discharge and followup instructions reviewed. Ambulates safely. 

## 2018-10-22 NOTE — ED Triage Notes (Signed)
Pt states she didn't get her period all of June, is having cramps, however, urine POC was negative. Only time prior to this one she has missed a period is when she is pregnant. Slight nausea in am, nad.

## 2018-10-22 NOTE — ED Notes (Signed)
POC urine preg NEGATIVE. 

## 2018-10-29 ENCOUNTER — Other Ambulatory Visit (HOSPITAL_COMMUNITY)
Admission: RE | Admit: 2018-10-29 | Discharge: 2018-10-29 | Disposition: A | Discharge status: Home / Self Care | Payer: BC Managed Care – PPO | Source: Ambulatory Visit | Attending: Obstetrics & Gynecology | Admitting: Obstetrics & Gynecology

## 2018-10-29 ENCOUNTER — Other Ambulatory Visit: Payer: Self-pay

## 2018-10-29 ENCOUNTER — Encounter: Payer: Self-pay | Admitting: Obstetrics & Gynecology

## 2018-10-29 ENCOUNTER — Ambulatory Visit (INDEPENDENT_AMBULATORY_CARE_PROVIDER_SITE_OTHER): Payer: BC Managed Care – PPO | Admitting: Obstetrics & Gynecology

## 2018-10-29 VITALS — BP 120/80 | Ht 60.0 in | Wt 142.0 lb

## 2018-10-29 DIAGNOSIS — Z124 Encounter for screening for malignant neoplasm of cervix: Secondary | ICD-10-CM | POA: Insufficient documentation

## 2018-10-29 DIAGNOSIS — N926 Irregular menstruation, unspecified: Secondary | ICD-10-CM

## 2018-10-29 NOTE — Patient Instructions (Signed)
Abnormal Uterine Bleeding °Abnormal uterine bleeding means bleeding more than usual from your uterus. It can include: °· Bleeding between periods. °· Bleeding after sex. °· Bleeding that is heavier than normal. °· Periods that last longer than usual. °· Bleeding after you have stopped having your period (menopause). °There are many problems that may cause this. You should see a doctor for any kind of bleeding that is not normal. Treatment depends on the cause of the bleeding. °Follow these instructions at home: °· Watch your condition for any changes. °· Do not use tampons, douche, or have sex, if your doctor tells you not to. °· Change your pads often. °· Get regular well-woman exams. Make sure they include a pelvic exam and cervical cancer screening. °· Keep all follow-up visits as told by your doctor. This is important. °Contact a doctor if: °· The bleeding lasts more than one week. °· You feel dizzy at times. °· You feel like you are going to throw up (nauseous). °· You throw up. °Get help right away if: °· You pass out. °· You have to change pads every hour. °· You have belly (abdominal) pain. °· You have a fever. °· You get sweaty. °· You get weak. °· You passing large blood clots from your vagina. °Summary °· Abnormal uterine bleeding means bleeding more than usual from your uterus. °· There are many problems that may cause this. You should see a doctor for any kind of bleeding that is not normal. °· Treatment depends on the cause of the bleeding. °This information is not intended to replace advice given to you by your health care provider. Make sure you discuss any questions you have with your health care provider. °Document Released: 01/30/2009 Document Revised: 03/29/2016 Document Reviewed: 03/29/2016 °Elsevier Patient Education © 2020 [employer] ° °

## 2018-10-29 NOTE — Progress Notes (Signed)
HPI:      Stefanie Mason is a 33 y.o. [phone] who LMP was Patient's last menstrual period was 10/23/2018., presents today for a problem visit.  She complains of periods starting to space out by a few days, then missed one in June; neg home preg test; has severe cramping. All this  began several months ago and its severity is described as moderate.  She has last menstrual period 1 weeks ago with moderate menstrual cramping.  She has used the following for attempts at control: maxi pad.  Previous evaluation: none. Prior Diagnosis: none; has had reg cycles since pregnancies and BTL 2+ years ago. Previous Treatment: none, no hormones.  She is single partner, contraception - tubal ligation.  Hx of STDs: none. She is premenopausal.  PMHx: She  has a past medical history of Anemia, Heart murmur, and Pregnancy induced hypertension. Also,  has a past surgical history that includes No past surgeries and Laparoscopic tubal ligation (Bilateral, 04/29/2016)., family history includes Diabetes in her maternal grandmother; Heart disease in her maternal grandmother; Stroke in her mother.,  reports that she quit smoking about 8 years ago. Her smoking use included cigarettes. She has a 1.00 pack-year smoking history. She has never used smokeless tobacco. She reports current alcohol use. She reports that she does not use drugs.  She  Current Outpatient Medications:  .  cephALEXin (KEFLEX) 500 MG capsule, Take 1 capsule (500 mg total) by mouth 2 (two) times daily. (Patient not taking: Reported on 10/29/2018), Disp: 14 capsule, Rfl: 0 .  ibuprofen (ADVIL) 200 MG tablet, Take 400-800 mg by mouth every 6 (six) hours as needed for fever, mild pain or cramping., Disp: , Rfl:  .  ibuprofen (ADVIL,MOTRIN) 600 MG tablet, Take 1 tablet (600 mg total) by mouth every 6 (six) hours as needed. (Patient not taking: Reported on 10/22/2018), Disp: 30 tablet, Rfl: 0  Also, is allergic to codeine.  Review of Systems   Constitutional: Negative for chills, fever and malaise/fatigue.  HENT: Negative for congestion, sinus pain and sore throat.   Eyes: Negative for blurred vision and pain.  Respiratory: Negative for cough and wheezing.   Cardiovascular: Negative for chest pain and leg swelling.  Gastrointestinal: Negative for abdominal pain, constipation, diarrhea, heartburn, nausea and vomiting.  Genitourinary: Negative for dysuria, frequency, hematuria and urgency.  Musculoskeletal: Negative for back pain, joint pain, myalgias and neck pain.  Skin: Negative for itching and rash.  Neurological: Negative for dizziness, tremors and weakness.  Endo/Heme/Allergies: Does not bruise/bleed easily.  Psychiatric/Behavioral: Negative for depression. The patient is not nervous/anxious and does not have insomnia.     Objective: BP 120/80   Ht 5' (1.524 m)   Wt 142 lb (64.4 kg)   LMP 10/23/2018   BMI 27.73 kg/m  Physical Exam Constitutional:      General: She is not in acute distress.    Appearance: She is well-developed.  Genitourinary:     Pelvic exam was performed with patient supine.     Vagina, uterus and rectum normal.     No lesions in the vagina.     No vaginal bleeding.     No cervical motion tenderness, friability, lesion or polyp.     Uterus is mobile.     Uterus is not enlarged.     No uterine mass detected.    Uterus is anteverted.     No right or left adnexal mass present.     Right adnexa not tender.  Left adnexa not tender.  HENT:     Head: Normocephalic and atraumatic. No laceration.     Right Ear: Hearing normal.     Left Ear: Hearing normal.     Mouth/Throat:     Pharynx: Uvula midline.  Eyes:     Pupils: Pupils are equal, round, and reactive to light.  Neck:     Musculoskeletal: Normal range of motion and neck supple.     Thyroid: No thyromegaly.  Cardiovascular:     Rate and Rhythm: Normal rate and regular rhythm.     Heart sounds: No murmur. No friction rub. No gallop.    Pulmonary:     Effort: Pulmonary effort is normal. No respiratory distress.     Breath sounds: Normal breath sounds. No wheezing.  Chest:     Breasts:        Right: No mass, skin change or tenderness.        Left: No mass, skin change or tenderness.  Abdominal:     General: Bowel sounds are normal. There is no distension.     Palpations: Abdomen is soft.     Tenderness: There is no abdominal tenderness. There is no rebound.  Musculoskeletal: Normal range of motion.  Neurological:     Mental Status: She is alert and oriented to person, place, and time.     Cranial Nerves: No cranial nerve deficit.  Skin:    General: Skin is warm and dry.  Psychiatric:        Judgment: Judgment normal.  Vitals signs reviewed.     ASSESSMENT/PLAN:  dysfunctional uterine bleeding  Problem List Items Addressed This Visit    Screening for cervical cancer    - overdue   Relevant Orders   Cytology - PAP   Irregular menses       Relevant Orders   US PELVIC COMPLETE WITH TRANSVAGINAL Patient has abnormal uterine bleeding . She has a normal exam today, with no evidence of lesions.  Evaluation includes the following: exam, labs such as hormonal testing, and pelvic ultrasound to evaluate for any structural gynecologic abnormalities.  Patient to follow up after testing.  Treatment option for menorrhagia or menometrorrhagia discussed in great detail with the patient.  Options include hormonal therapy, IUD therapy such as Mirena, D&C, Ablation, and Hysterectomy.  The pros and cons of each option discussed with patient.         Annamarie MajorPaul Jupiter Kabir, MD, Merlinda FrederickFACOG [facility], [facility] 10/29/2018  8:41 AM

## 2018-10-31 LAB — CYTOLOGY - PAP
Diagnosis: HIGH — AB
HPV: DETECTED — AB

## 2018-11-05 NOTE — Progress Notes (Signed)
Please arrange COLPO appt, either to replace 11/14/18 appt (Im ok if room available) or another day and time.

## 2018-11-06 ENCOUNTER — Telehealth: Payer: Self-pay | Admitting: Obstetrics & Gynecology

## 2018-11-06 NOTE — Telephone Encounter (Signed)
Patient is aware of change to schedule appointment

## 2018-11-06 NOTE — Telephone Encounter (Signed)
Patient calling to speak with Dr. Kenton Kingfisher about her pap smear results. Please advise

## 2018-11-06 NOTE — Telephone Encounter (Signed)
-----   Message from Gae Dry, MD sent at 11/05/2018  5:08 PM EDT ----- Please arrange COLPO appt, either to replace 11/14/18 appt (Im ok if room available) or another day and time.

## 2018-11-06 NOTE — Progress Notes (Signed)
LM, so as to discuss results of PAP and plan of management

## 2018-11-14 ENCOUNTER — Ambulatory Visit: Payer: BC Managed Care – PPO | Admitting: Obstetrics & Gynecology

## 2018-11-14 ENCOUNTER — Other Ambulatory Visit: Payer: Self-pay

## 2018-11-14 ENCOUNTER — Encounter: Payer: Self-pay | Admitting: Obstetrics & Gynecology

## 2018-11-14 ENCOUNTER — Other Ambulatory Visit (HOSPITAL_COMMUNITY)
Admission: RE | Admit: 2018-11-14 | Discharge: 2018-11-14 | Disposition: A | Discharge status: Home / Self Care | Payer: BC Managed Care – PPO | Source: Ambulatory Visit | Attending: Obstetrics & Gynecology | Admitting: Obstetrics & Gynecology

## 2018-11-14 ENCOUNTER — Ambulatory Visit (INDEPENDENT_AMBULATORY_CARE_PROVIDER_SITE_OTHER): Payer: BC Managed Care – PPO

## 2018-11-14 ENCOUNTER — Ambulatory Visit (INDEPENDENT_AMBULATORY_CARE_PROVIDER_SITE_OTHER): Payer: BC Managed Care – PPO | Admitting: Obstetrics & Gynecology

## 2018-11-14 VITALS — BP 120/80 | Ht 60.0 in | Wt 144.0 lb

## 2018-11-14 DIAGNOSIS — R87613 High grade squamous intraepithelial lesion on cytologic smear of cervix (HGSIL): Secondary | ICD-10-CM

## 2018-11-14 DIAGNOSIS — N926 Irregular menstruation, unspecified: Secondary | ICD-10-CM

## 2018-11-14 DIAGNOSIS — D060 Carcinoma in situ of endocervix: Secondary | ICD-10-CM

## 2018-11-14 NOTE — Progress Notes (Signed)
  HPI: Pt has been having irreg cycles w some pain.  No bleeding today. She complains of periods starting to space out by a few days, then missed one in June; neg home preg test; has severe cramping. All this  began several months ago and its severity is described as moderate.  She has last menstrual period 1 weeks ago with moderate menstrual cramping.  She has used the following for attempts at control: maxi pad. Ultrasound demonstrates no masses seen These findings are Pelvis normal  PMHx: She  has a past medical history of Anemia, Heart murmur, and Pregnancy induced hypertension. Also,  has a past surgical history that includes No past surgeries and Laparoscopic tubal ligation (Bilateral, 04/29/2016)., family history includes Diabetes in her maternal grandmother; Heart disease in her maternal grandmother; Stroke in her mother.,  reports that she quit smoking about 8 years ago. Her smoking use included cigarettes. She has a 1.00 pack-year smoking history. She has never used smokeless tobacco. She reports current alcohol use. She reports that she does not use drugs.  She has a current medication list which includes the following prescription(s): cephalexin, ibuprofen, and ibuprofen. Also, is allergic to codeine.  Review of Systems  All other systems reviewed and are negative.   Objective: BP 120/80   Ht 5' (1.524 m)   Wt 144 lb (65.3 kg)   LMP 10/23/2018   BMI 28.12 kg/m   Physical examination Constitutional NAD, Conversant  Skin No rashes, lesions or ulceration.   Extremities: Moves all appropriately.  Normal ROM for age. No lymphadenopathy.  Neuro: Grossly intact  Psych: Oriented to PPT.  Normal mood. Normal affect.   Assessment:  Irregular menses Hormonal over anatomic etiology based on results Options for hormones, ablation discussed To monitor for now  A total of 15 minutes were spent face-to-face with the patient during this encounter and over half of that time dealt with  counseling and coordination of care.  Barnett Applebaum, MD, [facility], [facility] 11/14/2018  5:08 PM

## 2018-11-14 NOTE — Progress Notes (Signed)
HPI:  Stefanie Mason is a 33 y.o.  [phone]  who presents today for evaluation and management of abnormal cervical cytology.    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  OB History  Gravida Para Term Preterm AB Living  4 3 3  0 1 2  SAB TAB Ectopic Multiple Live Births  1 0 0 0 2    # Outcome Date GA Lbr Len/2nd Weight Sex Delivery Anes PTL Lv  4 Term 01/12/16 [redacted]w[redacted]d / 00:09 7 lb 0.9 oz (3.2 kg) M Vag-Spont EPI  LIV  3 Term 12/02/11 [redacted]w[redacted]d   M    LIV  2 SAB 10/10/10 [redacted]w[redacted]d       FD  1 Term 03/20/08 [redacted]w[redacted]d   F    LIV    Past Medical History:  Diagnosis Date  . Anemia    H/O  . Heart murmur    ASYMPTOMATIC  . Pregnancy induced hypertension     Past Surgical History:  Procedure Laterality Date  . LAPAROSCOPIC TUBAL LIGATION Bilateral 04/29/2016   Procedure: LAPAROSCOPIC TUBAL LIGATION;  Surgeon: Malachy Mood, MD;  Location: [facility] ORS;  Service: Gynecology;  Laterality: Bilateral;  . NO PAST SURGERIES      SOCIAL HISTORY: Social History   Substance and Sexual Activity  Alcohol Use Yes   Comment: occasional    Social History   Substance and Sexual Activity  Drug Use No     Family History  Problem Relation Age of Onset  . Stroke Mother   . Diabetes Maternal Grandmother   . Heart disease Maternal Grandmother     ALLERGIES:  Codeine  Current Outpatient Medications on File Prior to Visit  Medication Sig Dispense Refill  . cephALEXin (KEFLEX) 500 MG capsule Take 1 capsule (500 mg total) by mouth 2 (two) times daily. (Patient not taking: Reported on 10/29/2018) 14 capsule 0  . ibuprofen (ADVIL) 200 MG tablet Take 400-800 mg by mouth every 6 (six) hours as needed for fever, mild pain or cramping.    Marland Kitchen ibuprofen (ADVIL,MOTRIN) 600 MG tablet Take 1 tablet (600 mg total) by mouth every 6 (six) hours as needed. (Patient not taking: Reported on 10/22/2018) 30 tablet 0   No  current facility-administered medications on file prior to visit.     Physical Exam: -Vitals:  BP 120/80   Ht 5' (1.524 m)   Wt 144 lb (65.3 kg)   LMP 10/23/2018   BMI 28.12 kg/m  GEN: WD, WN, NAD.  A+ O x 3, good mood and affect. ABD:  NT, ND.  Soft, no masses.  No hernias noted.   Pelvic:   Vulva: Normal appearance.  No lesions.  Vagina: No lesions or abnormalities noted.  Support: Normal pelvic support.  Urethra No masses tenderness or scarring.  Meatus Normal size without lesions or prolapse.  Cervix: See below.  Anus: Normal exam.  No lesions.  Perineum: Normal exam.  No lesions.        Bimanual   Uterus: Normal size.  Non-tender.  Mobile.  AV.  Adnexae: No masses.  Non-tender to palpation.  Cul-de-sac: Negative for abnormality.   PROCEDURE: 1.  Urine Pregnancy Test:  not done 2.  Colposcopy performed with 4% acetic acid after verbal consent obtained                                         -  Aceto-white Lesions Location(s): all around.              -Biopsy performed at 6, 9, 12 o'clock               -ECC indicated and performed: Yes.       -Biopsy sites made hemostatic with pressure, AgNO3, and/or Monsel's solution   -Satisfactory colposcopy: Yes.      -Evidence of Invasive cervical CA :  NO  ASSESSMENT:  Stefanie FootCassandra Mason is a 33 y.o. [id number] here for  1. Irregular menses   2. HGSIL (high grade squamous intraepithelial lesion) on Pap smear of cervix   .  PLAN: 1.  I discussed the grading system of pap smears and HPV high risk viral types.  We will discuss and base management after colpo results return. 2. Follow up PAP 6 months, vs intervention if high grade dysplasia identified     Annamarie MajorPaul Kahne Helfand, MD, Merlinda FrederickFACOG [facility], [facility] 11/14/2018  5:07 PM

## 2018-11-19 ENCOUNTER — Other Ambulatory Visit: Payer: Self-pay | Admitting: Obstetrics & Gynecology

## 2018-11-19 DIAGNOSIS — D069 Carcinoma in situ of cervix, unspecified: Secondary | ICD-10-CM

## 2018-11-19 NOTE — Progress Notes (Signed)
Results d/w pt PAP HGSIL Colpo/Bx CIN III all 3 bx and ECC Rec LEEP Prefers in office To schedule  Barnett Applebaum, MD, [facility], [facility] 11/19/2018  2:17 PM

## 2018-11-28 ENCOUNTER — Telehealth: Payer: Self-pay | Admitting: Obstetrics & Gynecology

## 2018-11-28 NOTE — Telephone Encounter (Signed)
Lmtrc

## 2018-11-28 NOTE — Telephone Encounter (Signed)
-----   Message from Gae Dry, MD sent at 11/19/2018  2:14 PM EDT ----- Regarding: In Office LEEP Surgery Booking Request Patient Full Name:  Stefanie Mason  MRN: [id number]  DOB: February 16, 1986  Surgeon: Hoyt Koch, MD  Requested Surgery Date and Time: Any- In Office Primary Diagnosis AND Code: CIN III  (D06.9) Secondary Diagnosis and Code:  Surgical Procedure: LEEP L&D Notification: No Admission Status: IN OFFICE Length of Surgery: 20 Special Case Needs: no H&P: yes for consents

## 2019-01-03 ENCOUNTER — Telehealth: Payer: Self-pay | Admitting: Obstetrics & Gynecology

## 2019-01-03 NOTE — Telephone Encounter (Signed)
Patient is aware of H&P for in-office procedure on 9/24 @ 4:30pm w/ Dr. Kenton Kingfisher, and IN OFFICE LEEP on 9/29 @ 3:50pm. Costs and [employer] were discussed.

## 2019-01-10 ENCOUNTER — Other Ambulatory Visit: Payer: Self-pay

## 2019-01-10 ENCOUNTER — Encounter: Payer: Self-pay | Admitting: Obstetrics & Gynecology

## 2019-01-10 ENCOUNTER — Ambulatory Visit (INDEPENDENT_AMBULATORY_CARE_PROVIDER_SITE_OTHER): Payer: BC Managed Care – PPO | Admitting: Obstetrics & Gynecology

## 2019-01-10 VITALS — BP 120/80 | Ht 61.0 in | Wt 144.0 lb

## 2019-01-10 DIAGNOSIS — D069 Carcinoma in situ of cervix, unspecified: Secondary | ICD-10-CM | POA: Diagnosis not present

## 2019-01-10 NOTE — Patient Instructions (Signed)
Cervical Conization, Care After This sheet gives you information about how to care for yourself after your procedure. Your doctor may also give you more specific instructions. If you have problems or questions, contact your doctor. Follow these instructions at home: Medicines   Take over-the-counter and prescription medicines only as told by your doctor.  Do not take aspirin until your doctor says it is okay.  If you take pain medicine: ? You may have constipation. To help treat this, your doctor may tell you to:  Drink enough fluid to keep your pee (urine) clear or pale yellow.  Take medicines.  Eat foods that are high in fiber. These include fresh fruits and vegetables, whole grains, bran, and beans.  Limit foods that are high in fat and sugar. These include fried foods and sweet foods. ? Do not drive or use heavy machines. General instructions  You can eat your usual diet unless your doctor tells you not to do so.  Take showers for the first week. Do not take baths, swim, or use hot tubs until your doctor says it is okay.  Do not douche, use tampons, or have sex until your doctor says it is okay.  For 7-14 days after your procedure, avoid: ? Being very active. ? Exercising. ? Heavy lifting.  Keep all follow-up visits as told by your doctor. This is important. Contact a doctor if:  You have a rash.  You are dizzy or lightheaded.  You feel sick to your stomach (nauseous).  You throw up (vomit).  You have fluid from your vagina (vaginal discharge) that smells bad. Get help right away if:  There are blood clots coming from your vagina.  You have more bleeding than you would have in a normal period. For example, you soak a pad in less than 1 hour.  You have a fever.  You have more and more cramps.  You pass out (faint).  You have pain when peeing.  Your have a lot of pain.  Your pain gets worse.  Your pain does not get better when you take your medicine.   You have blood in your pee.  You throw up (vomit). Summary  After your procedure, take over-the-counter and prescription medicines only as told by your doctor.  Do not douche, use tampons, or have sex until your doctor says it is okay.  For about 7-14 days after your procedure, try not to exercise or lift heavy objects.  Get help right away if you have new symptoms, or if your symptoms become worse. This information is not intended to replace advice given to you by your health care provider. Make sure you discuss any questions you have with your health care provider. Document Released: 01/12/2008 Document Revised: 03/17/2017 Document Reviewed: 04/06/2016 Elsevier Patient Education  2020 Elsevier Inc.  

## 2019-01-10 NOTE — Progress Notes (Signed)
  History of Present Illness:  Stefanie Mason is a 33 y.o. who has cervical dysplasia in need of treatment.  Pt presents to counsel about this. She had a HGSIL PAP, followed by biopsies recently that revealed CIN III in all three ectocervical, and also endocervical, biopsies.  She has no pain, bleeding , or other sx's of concern at this time.  No prior h/o cancer.  2 childern.  BTL done in past.  PMHx: She  has a past medical history of Anemia, Heart murmur, and Pregnancy induced hypertension. Also,  has a past surgical history that includes No past surgeries and Laparoscopic tubal ligation (Bilateral, 04/29/2016)., family history includes Diabetes in her maternal grandmother; Heart disease in her maternal grandmother; Stroke in her mother.,  reports that she quit smoking about 8 years ago. Her smoking use included cigarettes. She has a 1.00 pack-year smoking history. She has never used smokeless tobacco. She reports current alcohol use. She reports that she does not use drugs. No outpatient medications have been marked as taking for the 01/10/19 encounter (Office Visit) with Gae Dry, MD.  . Also, is allergic to codeine..  Review of Systems  All other systems reviewed and are negative.   Physical Exam:  BP 120/80   Ht 5\' 1"  (1.549 m)   Wt 144 lb (65.3 kg)   BMI 27.21 kg/m  Body mass index is 27.21 kg/m. Constitutional: Well nourished, well developed female in no acute distress.  Abdomen: diffusely non tender to palpation, non distended, and no masses, hernias Neuro: Grossly intact Psych:  Normal mood and affect.    Assessment:  Problem List Items Addressed This Visit    CIN III (cervical intraepithelial neoplasia grade III) with severe dysplasia    -  Primary      Plan: Detailed discussion of results today. Options for management discussed. Info provided. At this time, we will plan for LEEP as in office surgical management.  Pros and cons, and  recovery discussed.  Consent  obtained. Meds provided to patient for procedure.  A total of 15 minutes were spent face-to-face with the patient during this encounter and over half of that time dealt with counseling and coordination of care.  Barnett Applebaum, MD, [facility], [facility] 01/10/2019  4:04 PM

## 2019-01-14 ENCOUNTER — Ambulatory Visit (INDEPENDENT_AMBULATORY_CARE_PROVIDER_SITE_OTHER): Payer: BC Managed Care – PPO | Admitting: Obstetrics & Gynecology

## 2019-01-14 ENCOUNTER — Encounter: Payer: Self-pay | Admitting: Obstetrics & Gynecology

## 2019-01-14 ENCOUNTER — Other Ambulatory Visit: Payer: Self-pay

## 2019-01-14 ENCOUNTER — Other Ambulatory Visit (HOSPITAL_COMMUNITY)
Admission: RE | Admit: 2019-01-14 | Discharge: 2019-01-14 | Disposition: A | Discharge status: Home / Self Care | Payer: BC Managed Care – PPO | Source: Ambulatory Visit | Attending: Obstetrics & Gynecology | Admitting: Obstetrics & Gynecology

## 2019-01-14 VITALS — BP 100/60 | Ht 60.0 in | Wt 148.0 lb

## 2019-01-14 DIAGNOSIS — D069 Carcinoma in situ of cervix, unspecified: Secondary | ICD-10-CM

## 2019-01-14 NOTE — Patient Instructions (Signed)
Cervical Conization, Care After This sheet gives you information about how to care for yourself after your procedure. Your doctor may also give you more specific instructions. If you have problems or questions, contact your doctor. Follow these instructions at home: Medicines   Take over-the-counter and prescription medicines only as told by your doctor.  Do not take aspirin until your doctor says it is okay.  If you take pain medicine: ? You may have constipation. To help treat this, your doctor may tell you to:  Drink enough fluid to keep your pee (urine) clear or pale yellow.  Take medicines.  Eat foods that are high in fiber. These include fresh fruits and vegetables, whole grains, bran, and beans.  Limit foods that are high in fat and sugar. These include fried foods and sweet foods. ? Do not drive or use heavy machines. General instructions  You can eat your usual diet unless your doctor tells you not to do so.  Take showers for the first week. Do not take baths, swim, or use hot tubs until your doctor says it is okay.  Do not douche, use tampons, or have sex until your doctor says it is okay.  For 7-14 days after your procedure, avoid: ? Being very active. ? Exercising. ? Heavy lifting.  Keep all follow-up visits as told by your doctor. This is important. Contact a doctor if:  You have a rash.  You are dizzy or lightheaded.  You feel sick to your stomach (nauseous).  You throw up (vomit).  You have fluid from your vagina (vaginal discharge) that smells bad. Get help right away if:  There are blood clots coming from your vagina.  You have more bleeding than you would have in a normal period. For example, you soak a pad in less than 1 hour.  You have a fever.  You have more and more cramps.  You pass out (faint).  You have pain when peeing.  Your have a lot of pain.  Your pain gets worse.  Your pain does not get better when you take your medicine.   You have blood in your pee.  You throw up (vomit). Summary  After your procedure, take over-the-counter and prescription medicines only as told by your doctor.  Do not douche, use tampons, or have sex until your doctor says it is okay.  For about 7-14 days after your procedure, try not to exercise or lift heavy objects.  Get help right away if you have new symptoms, or if your symptoms become worse. This information is not intended to replace advice given to you by your health care provider. Make sure you discuss any questions you have with your health care provider. Document Released: 01/12/2008 Document Revised: 03/17/2017 Document Reviewed: 04/06/2016 Elsevier Patient Education  2020 Elsevier Inc.  

## 2019-01-14 NOTE — Progress Notes (Signed)
  LEEP PROCEDURE NOTE  The LEEP has been explained to the patient in detail; risks/benefits reviewed.  The risks include, but are not limited to, bleeding, infection, and the possibility of cervical stenosis or cervical incompetence.  The patient had previously been given information regarding abnormal PAP smears and their relationship to HPV.  We have discussed the natural course and history of HPV, the possibility of incomplete treatment by the LEEP, as well as the possibility of recurrence.  I have reviewed the consent form for LEEP with her, and she fully understands its contents.  We have discussed the procedure itself. I have informed her that following the LEEP she should refrain from intercourse and the use of tampons for three weeks, and that she should also expect some spotting and brown/black discharge over the next several days.  We have discussed the fact that vaginal bleeding, differentiated from spotting, is not normal and that if she should have this complication, she should contact me immediately.  The follow-up after LEEP will be PAP smears or viral typing performed at regular intervals for up to 3-5 years.  Should these all prove to be normal, she will then be back on typical cervical screening.  I have answered all of her questions, and I believe she has an adequate understanding of the LEEP, its implications, and the necessity of follow-up care.  I discussed her colpo results and explained the procedure of LEEP.  All questions were answered and she signed the consent form.    LEEP performed in the usual manner after reviewing the previous colpo findings and results. Acetic acid solution was usesd to identify any abnormal areas of the cervix.  The cervix was cleansed with betadine solution. Local injection of Lidocaine was performed for anesthesia. Ectocervical and then endocervical specimens obtained using the loop electrodes without difficulty.  It was labeled accordingly. The base  and edges of the defect was then cauterized using coagulation current.  Plan PAP f/u 3 mos if clear margins and no carcinoma  Barnett Applebaum, M.D. 01/14/2019 4:24 PM

## 2019-01-15 ENCOUNTER — Ambulatory Visit: Payer: BC Managed Care – PPO | Admitting: Obstetrics & Gynecology

## 2019-01-16 LAB — SURGICAL PATHOLOGY

## 2019-01-17 ENCOUNTER — Telehealth: Payer: Self-pay

## 2019-01-17 NOTE — Telephone Encounter (Signed)
Pt calling; had LEEP on Monday; usually has period in the middle of the month; is bleeding  - not sure if period or from procedure.  [phone] Left detailed msg for pt to schedule appt.

## 2019-01-22 NOTE — Telephone Encounter (Signed)
Left another detailed msg to call and sched appt.

## 2019-01-24 NOTE — Progress Notes (Signed)
Surgery Booking Request Patient Full Name:  Meridith Romick  MRN: [id number]  DOB: [birth year]  Surgeon: Hoyt Koch, MD  Requested Surgery Date and Time: Any Primary Diagnosis AND Code: (D06.9) CIN III (cervical intraepithelial neoplasia grade III) with severe dysplasia  (primary encounter diagnosis) Secondary Diagnosis and Code: Prior LEEP incomplete for excision of CIN III Surgical Procedure: InOffice LEEP L&D Notification: No Admission Status: In Office Length of Surgery: 20 min Special Case Needs: No H&P: Yes Phone Interview???:  No Interpreter: No Language:  Medical Clearance:  No Special Scheduling Instructions: no Any known health/anesthesia issues, diabetes, sleep apnea, latex allergy, defibrillator/pacemaker?: No Acuity: P2   (P1 highest, P2 delay may cause harm, P3 low, elective gyn, P4 lowest)

## 2019-01-24 NOTE — Progress Notes (Signed)
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

## 2019-01-25 NOTE — Telephone Encounter (Signed)
Left second detailed msg to call and let me know what's going on.

## 2019-01-28 ENCOUNTER — Telehealth: Payer: Self-pay | Admitting: Obstetrics & Gynecology

## 2019-01-28 NOTE — Telephone Encounter (Signed)
-----   Message from Gae Dry, MD sent at 01/24/2019 11:10 AM EDT ----- Surgery Booking Request Patient Full Name:  Florita Nitsch  MRN: [id number]  DOB: 30-May-1985  Surgeon: Hoyt Koch, MD  Requested Surgery Date and Time: Any Primary Diagnosis AND Code: (D06.9) CIN III (cervical intraepithelial neoplasia grade III) with severe dysplasia  (primary encounter diagnosis) Secondary Diagnosis and Code: Prior LEEP incomplete for excision of CIN III Surgical Procedure: InOffice LEEP L&D Notification: No Admission Status: In Office Length of Surgery: 20 min Special Case Needs: No H&P: Yes Phone Interview???:  No Interpreter: No Language:  Medical Clearance:  No Special Scheduling Instructions: no Any known health/anesthesia issues, diabetes, sleep apnea, latex allergy, defibrillator/pacemaker?: No Acuity: P2   (P1 highest, P2 delay may cause harm, P3 low, elective gyn, P4 lowest)

## 2019-01-28 NOTE — Telephone Encounter (Signed)
Pt hasn't returned call.  Message closed. 

## 2019-01-28 NOTE — Telephone Encounter (Signed)
Patient is aware of H&P on 02/11/19 @ 3:50pm w/ Dr. Kenton Kingfisher, and IN-OFFICE LEEP on 02/19/19 @ 3:50pm.

## 2019-02-11 ENCOUNTER — Encounter: Payer: Self-pay | Admitting: Obstetrics & Gynecology

## 2019-02-11 ENCOUNTER — Other Ambulatory Visit: Payer: Self-pay

## 2019-02-11 ENCOUNTER — Ambulatory Visit (INDEPENDENT_AMBULATORY_CARE_PROVIDER_SITE_OTHER): Payer: BC Managed Care – PPO | Admitting: Obstetrics & Gynecology

## 2019-02-11 VITALS — BP 120/80 | Ht 60.0 in | Wt 143.0 lb

## 2019-02-11 DIAGNOSIS — D069 Carcinoma in situ of cervix, unspecified: Secondary | ICD-10-CM

## 2019-02-11 NOTE — Progress Notes (Signed)
  HPI:  Patient is a 33 y.o. T5V2023 presenting for follow up abnormal PAP smear in the past. 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 Will repeat LEEP to get clear margins.  PMHx: She  has a past medical history of Anemia, Heart murmur, and Pregnancy induced hypertension. Also,  has a past surgical history that includes No past surgeries and Laparoscopic tubal ligation (Bilateral, 04/29/2016)., family history includes Diabetes in her maternal grandmother; Heart disease in her maternal grandmother; Stroke in her mother.,  reports that she quit smoking about 8 years ago. Her smoking use included cigarettes. She has a 1.00 pack-year smoking history. She has never used smokeless tobacco. She reports current alcohol use. She reports that she does not use drugs.  She has a current medication list which includes the following prescription(s): diazepam, hydrocodone-acetaminophen, ibuprofen, and promethazine. Also, is allergic to codeine.  Review of Systems  All other systems reviewed and are negative.   Objective: BP 120/80   Ht 5' (1.524 m)   Wt 143 lb (64.9 kg)   BMI 27.93 kg/m  Filed Weights   02/11/19 1555  Weight: 143 lb (64.9 kg)   Body mass index is 27.93 kg/m.  Physical examination Physical Exam Constitutional:      General: She is not in acute distress.    Appearance: She is well-developed.  Musculoskeletal: Normal range of motion.  Neurological:     Mental Status: She is alert and oriented to person, place, and time.  Skin:    General: Skin is warm and dry.  Vitals signs reviewed.     ASSESSMENT:  History of Cervical Dysplasia- CIN III Recent LEEP with POS margin  Plan: Plan repeat LEEP to acheieve clear margins Alternatives discussed; cryo, hysterectomy, serial exams. Meds given for procedure day Consent obtained  A total of 15 minutes were spent face-to-face with the patient during this encounter  and over half of that time dealt with counseling and coordination of care.  Barnett Applebaum, MD, [facility], [facility] 02/11/2019  4:19 PM

## 2019-02-18 ENCOUNTER — Telehealth: Payer: Self-pay | Admitting: Obstetrics & Gynecology

## 2019-02-18 NOTE — Telephone Encounter (Signed)
Noted. JP, CMA aware.

## 2019-02-18 NOTE — Telephone Encounter (Signed)
Patient is aware of rescheduled in-office LEEP on 03/11/19 @ 3:50pm w/ Dr. Kenton Kingfisher.

## 2019-02-19 ENCOUNTER — Ambulatory Visit: Payer: BC Managed Care – PPO | Admitting: Obstetrics & Gynecology

## 2019-03-11 ENCOUNTER — Encounter: Payer: Self-pay | Admitting: Obstetrics & Gynecology

## 2019-03-11 ENCOUNTER — Other Ambulatory Visit (HOSPITAL_COMMUNITY)
Admission: RE | Admit: 2019-03-11 | Discharge: 2019-03-11 | Disposition: A | Discharge status: Home / Self Care | Payer: BC Managed Care – PPO | Source: Ambulatory Visit | Attending: Obstetrics & Gynecology | Admitting: Obstetrics & Gynecology

## 2019-03-11 ENCOUNTER — Other Ambulatory Visit: Payer: Self-pay

## 2019-03-11 ENCOUNTER — Ambulatory Visit (INDEPENDENT_AMBULATORY_CARE_PROVIDER_SITE_OTHER): Payer: BC Managed Care – PPO | Admitting: Obstetrics & Gynecology

## 2019-03-11 VITALS — BP 120/80 | Temp 98.1°F | Ht 60.0 in | Wt 148.0 lb

## 2019-03-11 DIAGNOSIS — D069 Carcinoma in situ of cervix, unspecified: Secondary | ICD-10-CM | POA: Diagnosis not present

## 2019-03-11 NOTE — Progress Notes (Signed)
  LEEP PROCEDURE NOTE  CIN III positive margin from prior LEEP  The LEEP has been explained to the patient in detail; risks/benefits reviewed.  The risks include, but are not limited to, bleeding, infection, and the possibility of cervical stenosis or cervical incompetence.  The patient had previously been given information regarding abnormal PAP smears and their relationship to HPV.  We have discussed the natural course and history of HPV, the possibility of incomplete treatment by the LEEP, as well as the possibility of recurrence.  I have reviewed the consent form for LEEP with her, and she fully understands its contents.  We have discussed the procedure itself. I have informed her that following the LEEP she should refrain from intercourse and the use of tampons for three weeks, and that she should also expect some spotting and brown/black discharge over the next several days.  We have discussed the fact that vaginal bleeding, differentiated from spotting, is not normal and that if she should have this complication, she should contact me immediately.  The follow-up after LEEP will be PAP smears or viral typing performed at regular intervals for up to 3-5 years.  Should these all prove to be normal, she will then be back on typical cervical screening.  I have answered all of her questions, and I believe she has an adequate understanding of the LEEP, its implications, and the necessity of follow-up care.  I discussed her colpo results and explained the procedure of LEEP.  All questions were answered and she signed the consent form.    LEEP performed in the usual manner after reviewing the previous colpo findings and results. Acetic acid solution was usesd to identify any abnormal areas of the cervix.  The cervix was cleansed with betadine solution. Local injection of Lidocaine was performed for anesthesia. Ectocervical (12, 6 o'clock portions) and then endocervical specimens obtained using the loop  electrodes without difficulty.  It was labeled accordingly. The base and edges of the defect was then cauterized using coagulation current.  Barnett Applebaum, M.D. 03/11/2019 4:25 PM

## 2019-03-11 NOTE — Patient Instructions (Signed)
Cervical Conization, Care After This sheet gives you information about how to care for yourself after your procedure. Your doctor may also give you more specific instructions. If you have problems or questions, contact your doctor. Follow these instructions at home: Medicines   Take over-the-counter and prescription medicines only as told by your doctor.  Do not take aspirin until your doctor says it is okay.  If you take pain medicine: ? You may have constipation. To help treat this, your doctor may tell you to:  Drink enough fluid to keep your pee (urine) clear or pale yellow.  Take medicines.  Eat foods that are high in fiber. These include fresh fruits and vegetables, whole grains, bran, and beans.  Limit foods that are high in fat and sugar. These include fried foods and sweet foods. ? Do not drive or use heavy machines. General instructions  You can eat your usual diet unless your doctor tells you not to do so.  Take showers for the first week. Do not take baths, swim, or use hot tubs until your doctor says it is okay.  Do not douche, use tampons, or have sex until your doctor says it is okay.  For 7-14 days after your procedure, avoid: ? Being very active. ? Exercising. ? Heavy lifting.  Keep all follow-up visits as told by your doctor. This is important. Contact a doctor if:  You have a rash.  You are dizzy or lightheaded.  You feel sick to your stomach (nauseous).  You throw up (vomit).  You have fluid from your vagina (vaginal discharge) that smells bad. Get help right away if:  There are blood clots coming from your vagina.  You have more bleeding than you would have in a normal period. For example, you soak a pad in less than 1 hour.  You have a fever.  You have more and more cramps.  You pass out (faint).  You have pain when peeing.  Your have a lot of pain.  Your pain gets worse.  Your pain does not get better when you take your medicine.   You have blood in your pee.  You throw up (vomit). Summary  After your procedure, take over-the-counter and prescription medicines only as told by your doctor.  Do not douche, use tampons, or have sex until your doctor says it is okay.  For about 7-14 days after your procedure, try not to exercise or lift heavy objects.  Get help right away if you have new symptoms, or if your symptoms become worse. This information is not intended to replace advice given to you by your health care provider. Make sure you discuss any questions you have with your health care provider. Document Released: 01/12/2008 Document Revised: 03/17/2017 Document Reviewed: 04/06/2016 Elsevier Patient Education  2020 Elsevier Inc.  

## 2019-03-13 LAB — SURGICAL PATHOLOGY

## 2019-03-16 NOTE — Progress Notes (Signed)
Results d/w pt. Second LEEP, still positive endocervical margins for CIN III (all three specimens) Options of hysterectomy discussed  Barnett Applebaum, MD, [facility], [facility] 03/16/2019  1:03 PM

## 2019-03-21 ENCOUNTER — Telehealth: Payer: Self-pay | Admitting: Obstetrics & Gynecology

## 2019-03-21 NOTE — Telephone Encounter (Signed)
Patient is aware of H&P at [facility] on 04/01/19 @ 4:10pm w/ Dr. Kenton Kingfisher, Pre-admit testing to be scheduled, COVID testing on 12/24, and OR on 04/16/19. Patient is aware to quarantine after COVID testing. Patient is aware she may receive calls from the [facility]. Patient confirmed BCBS, and no secondary insurance.

## 2019-03-21 NOTE — Telephone Encounter (Signed)
-----   Message from Gae Dry, MD sent at 03/16/2019 12:57 PM EST ----- Regarding: Surgery Surgery Booking Request Patient Full Name:  Stefanie Mason  MRN: [id number]  DOB: 1985-05-29  Surgeon: Hoyt Koch, MD  Requested Surgery Date and Time: Any Primary Diagnosis AND Code: Cervical Dyplasia, CIN III (D06.9) Secondary Diagnosis and Code:  Surgical Procedure: TLH/BS L&D Notification: No Admission Status: same day surgery Length of Surgery: 1 hr Special Case Needs: No H&P: Yes Phone Interview???:  Yes Interpreter: No Language:  Medical Clearance:  No Special Scheduling Instructions: none Any known health/anesthesia issues, diabetes, sleep apnea, latex allergy, defibrillator/pacemaker?: No Acuity: P2 (CANCER)   (P1 highest, P2 delay may cause harm, P3 low, elective gyn, P4 lowest)

## 2019-03-29 ENCOUNTER — Telehealth: Payer: Self-pay | Admitting: Obstetrics & Gynecology

## 2019-03-29 NOTE — Telephone Encounter (Signed)
Patient's BCBS insurance termed 03/18/19. Lmtrc.

## 2019-04-01 ENCOUNTER — Ambulatory Visit (INDEPENDENT_AMBULATORY_CARE_PROVIDER_SITE_OTHER): Payer: BC Managed Care – PPO | Admitting: Obstetrics & Gynecology

## 2019-04-01 ENCOUNTER — Encounter: Payer: Self-pay | Admitting: Obstetrics & Gynecology

## 2019-04-01 ENCOUNTER — Other Ambulatory Visit: Payer: Self-pay

## 2019-04-01 VITALS — BP 122/82 | Ht 60.0 in | Wt 152.0 lb

## 2019-04-01 DIAGNOSIS — D069 Carcinoma in situ of cervix, unspecified: Secondary | ICD-10-CM

## 2019-04-01 DIAGNOSIS — R87613 High grade squamous intraepithelial lesion on cytologic smear of cervix (HGSIL): Secondary | ICD-10-CM

## 2019-04-01 DIAGNOSIS — Z01818 Encounter for other preprocedural examination: Secondary | ICD-10-CM

## 2019-04-01 NOTE — Progress Notes (Signed)
PRE-OPERATIVE HISTORY AND PHYSICAL EXAM  HPI:  Stefanie Mason is a 33 y.o. [id number] No LMP recorded.; she is being admitted for surgery related to CIN III.  SHe has had LEEP w positive margins twice, and so plans for hysterectomy at this time for resolution of CIN III and to decrease risks for cervical cancer.Marland Kitchen  PMHx: Past Medical History:  Diagnosis Date  . Anemia    H/O  . Heart murmur    ASYMPTOMATIC  . Pregnancy induced hypertension    Past Surgical History:  Procedure Laterality Date  . LAPAROSCOPIC TUBAL LIGATION Bilateral 04/29/2016   Procedure: LAPAROSCOPIC TUBAL LIGATION;  Surgeon: Malachy Mood, MD;  Location: [facility] ORS;  Service: Gynecology;  Laterality: Bilateral;  . NO PAST SURGERIES     Family History  Problem Relation Age of Onset  . Stroke Mother   . Diabetes Maternal Grandmother   . Heart disease Maternal Grandmother    Social History   Tobacco Use  . Smoking status: Former Smoker    Packs/day: 0.25    Years: 4.00    Pack years: 1.00    Types: Cigarettes    Quit date: 04/25/2010    Years since quitting: 8.9  . Smokeless tobacco: Never Used  Substance Use Topics  . Alcohol use: Yes    Comment: occasional   . Drug use: No    Current Outpatient Medications:  .  ibuprofen (ADVIL) 200 MG tablet, Take 400-800 mg by mouth every 6 (six) hours as needed for fever, mild pain or cramping., Disp: , Rfl:  Allergies: Codeine  Review of Systems  Constitutional: Negative for chills, fever and malaise/fatigue.  HENT: Negative for congestion, sinus pain and sore throat.   Eyes: Negative for blurred vision and pain.  Respiratory: Negative for cough and wheezing.   Cardiovascular: Negative for chest pain and leg swelling.  Gastrointestinal: Negative for abdominal pain, constipation, diarrhea, heartburn, nausea and vomiting.  Genitourinary: Negative for dysuria, frequency, hematuria and urgency.  Musculoskeletal: Negative for back pain, joint pain, myalgias and  neck pain.  Skin: Negative for itching and rash.  Neurological: Negative for dizziness, tremors and weakness.  Endo/Heme/Allergies: Does not bruise/bleed easily.  Psychiatric/Behavioral: Negative for depression. The patient is not nervous/anxious and does not have insomnia.     Objective: BP 122/82   Ht 5' (1.524 m)   Wt 152 lb (68.9 kg)   BMI 29.69 kg/m   Filed Weights   04/01/19 1602  Weight: 152 lb (68.9 kg)   Physical Exam Constitutional:      General: She is not in acute distress.    Appearance: She is well-developed.  HENT:     Head: Normocephalic and atraumatic. No laceration.     Right Ear: Hearing normal.     Left Ear: Hearing normal.     Mouth/Throat:     Pharynx: Uvula midline.  Eyes:     Pupils: Pupils are equal, round, and reactive to light.  Neck:     Thyroid: No thyromegaly.  Cardiovascular:     Rate and Rhythm: Normal rate and regular rhythm.     Heart sounds: No murmur. No friction rub. No gallop.   Pulmonary:     Effort: Pulmonary effort is normal. No respiratory distress.     Breath sounds: Normal breath sounds. No wheezing.  Chest:     Breasts:        Right: No mass, skin change or tenderness.        Left: No  mass, skin change or tenderness.  Abdominal:     General: Bowel sounds are normal. There is no distension.     Palpations: Abdomen is soft.     Tenderness: There is no abdominal tenderness. There is no rebound.  Musculoskeletal:        General: Normal range of motion.     Cervical back: Normal range of motion and neck supple.  Neurological:     Mental Status: She is alert and oriented to person, place, and time.     Cranial Nerves: No cranial nerve deficit.  Skin:    General: Skin is warm and dry.  Psychiatric:        Judgment: Judgment normal.  Vitals reviewed.     Assessment: 1. CIN III (cervical intraepithelial neoplasia grade III) with severe dysplasia   Plan TLH BS due to persistance of margins of CIN III with two prior LEEP  procedures.  I have had a careful discussion with this patient about all the options available and the risk/benefits of each. I have fully informed this patient that surgery may subject her to a variety of discomforts and risks: She understands that most patients have surgery with little difficulty, but problems can happen ranging from minor to fatal. These include nausea, vomiting, pain, bleeding, infection, poor healing, hernia, or formation of adhesions. Unexpected reactions may occur from any drug or anesthetic given. Unintended injury may occur to other pelvic or abdominal structures such as Fallopian tubes, ovaries, bladder, ureter (tube from kidney to bladder), or bowel. Nerves going from the pelvis to the legs may be injured. Any such injury may require immediate or later additional surgery to correct the problem. Excessive blood loss requiring transfusion is very unlikely but possible. Dangerous blood clots may form in the legs or lungs. Physical and sexual activity will be restricted in varying degrees for an indeterminate period of time but most often 2-6 weeks.  Finally, she understands that it is impossible to list every possible undesirable effect and that the condition for which surgery is done is not always cured or significantly improved, and in rare cases may be even worse.Ample time was given to answer all questions.  Annamarie Major, MD, [facility], [facility] 04/01/2019  5:34 PM

## 2019-04-01 NOTE — H&P (View-Only) (Signed)
PRE-OPERATIVE HISTORY AND PHYSICAL EXAM  HPI:  Stefanie Mason is a 33 y.o. [id number] No LMP recorded.; she is being admitted for surgery related to CIN III.  SHe has had LEEP w positive margins twice, and so plans for hysterectomy at this time for resolution of CIN III and to decrease risks for cervical cancer.Marland Kitchen  PMHx: Past Medical History:  Diagnosis Date  . Anemia    H/O  . Heart murmur    ASYMPTOMATIC  . Pregnancy induced hypertension    Past Surgical History:  Procedure Laterality Date  . LAPAROSCOPIC TUBAL LIGATION Bilateral 04/29/2016   Procedure: LAPAROSCOPIC TUBAL LIGATION;  Surgeon: Malachy Mood, MD;  Location: [facility] ORS;  Service: Gynecology;  Laterality: Bilateral;  . NO PAST SURGERIES     Family History  Problem Relation Age of Onset  . Stroke Mother   . Diabetes Maternal Grandmother   . Heart disease Maternal Grandmother    Social History   Tobacco Use  . Smoking status: Former Smoker    Packs/day: 0.25    Years: 4.00    Pack years: 1.00    Types: Cigarettes    Quit date: 04/25/2010    Years since quitting: 8.9  . Smokeless tobacco: Never Used  Substance Use Topics  . Alcohol use: Yes    Comment: occasional   . Drug use: No    Current Outpatient Medications:  .  ibuprofen (ADVIL) 200 MG tablet, Take 400-800 mg by mouth every 6 (six) hours as needed for fever, mild pain or cramping., Disp: , Rfl:  Allergies: Codeine  Review of Systems  Constitutional: Negative for chills, fever and malaise/fatigue.  HENT: Negative for congestion, sinus pain and sore throat.   Eyes: Negative for blurred vision and pain.  Respiratory: Negative for cough and wheezing.   Cardiovascular: Negative for chest pain and leg swelling.  Gastrointestinal: Negative for abdominal pain, constipation, diarrhea, heartburn, nausea and vomiting.  Genitourinary: Negative for dysuria, frequency, hematuria and urgency.  Musculoskeletal: Negative for back pain, joint pain, myalgias and  neck pain.  Skin: Negative for itching and rash.  Neurological: Negative for dizziness, tremors and weakness.  Endo/Heme/Allergies: Does not bruise/bleed easily.  Psychiatric/Behavioral: Negative for depression. The patient is not nervous/anxious and does not have insomnia.     Objective: BP 122/82   Ht 5' (1.524 m)   Wt 152 lb (68.9 kg)   BMI 29.69 kg/m   Filed Weights   04/01/19 1602  Weight: 152 lb (68.9 kg)   Physical Exam Constitutional:      General: She is not in acute distress.    Appearance: She is well-developed.  HENT:     Head: Normocephalic and atraumatic. No laceration.     Right Ear: Hearing normal.     Left Ear: Hearing normal.     Mouth/Throat:     Pharynx: Uvula midline.  Eyes:     Pupils: Pupils are equal, round, and reactive to light.  Neck:     Thyroid: No thyromegaly.  Cardiovascular:     Rate and Rhythm: Normal rate and regular rhythm.     Heart sounds: No murmur. No friction rub. No gallop.   Pulmonary:     Effort: Pulmonary effort is normal. No respiratory distress.     Breath sounds: Normal breath sounds. No wheezing.  Chest:     Breasts:        Right: No mass, skin change or tenderness.        Left: No  mass, skin change or tenderness.  Abdominal:     General: Bowel sounds are normal. There is no distension.     Palpations: Abdomen is soft.     Tenderness: There is no abdominal tenderness. There is no rebound.  Musculoskeletal:        General: Normal range of motion.     Cervical back: Normal range of motion and neck supple.  Neurological:     Mental Status: She is alert and oriented to person, place, and time.     Cranial Nerves: No cranial nerve deficit.  Skin:    General: Skin is warm and dry.  Psychiatric:        Judgment: Judgment normal.  Vitals reviewed.     Assessment: 1. CIN III (cervical intraepithelial neoplasia grade III) with severe dysplasia   Plan TLH BS due to persistance of margins of CIN III with two prior LEEP  procedures.  I have had a careful discussion with this patient about all the options available and the risk/benefits of each. I have fully informed this patient that surgery may subject her to a variety of discomforts and risks: She understands that most patients have surgery with little difficulty, but problems can happen ranging from minor to fatal. These include nausea, vomiting, pain, bleeding, infection, poor healing, hernia, or formation of adhesions. Unexpected reactions may occur from any drug or anesthetic given. Unintended injury may occur to other pelvic or abdominal structures such as Fallopian tubes, ovaries, bladder, ureter (tube from kidney to bladder), or bowel. Nerves going from the pelvis to the legs may be injured. Any such injury may require immediate or later additional surgery to correct the problem. Excessive blood loss requiring transfusion is very unlikely but possible. Dangerous blood clots may form in the legs or lungs. Physical and sexual activity will be restricted in varying degrees for an indeterminate period of time but most often 2-6 weeks.  Finally, she understands that it is impossible to list every possible undesirable effect and that the condition for which surgery is done is not always cured or significantly improved, and in rare cases may be even worse.Ample time was given to answer all questions.  Annamarie Major, MD, [facility], [facility] 04/01/2019  5:34 PM

## 2019-04-01 NOTE — Patient Instructions (Signed)

## 2019-04-03 NOTE — Telephone Encounter (Signed)
Lmtrc

## 2019-04-04 ENCOUNTER — Telehealth: Payer: Self-pay

## 2019-04-04 NOTE — Telephone Encounter (Signed)
FMLA/DISABILITY form for [employer] filled out, signature obtained and given to KT for processing.

## 2019-04-08 DIAGNOSIS — Z0289 Encounter for other administrative examinations: Secondary | ICD-10-CM

## 2019-04-09 ENCOUNTER — Other Ambulatory Visit: Payer: Self-pay

## 2019-04-09 ENCOUNTER — Encounter
Admission: RE | Admit: 2019-04-09 | Discharge: 2019-04-09 | Disposition: A | Discharge status: Home / Self Care | Payer: BC Managed Care – PPO | Source: Ambulatory Visit | Attending: Obstetrics & Gynecology | Admitting: Obstetrics & Gynecology

## 2019-04-09 DIAGNOSIS — Z01818 Encounter for other preprocedural examination: Secondary | ICD-10-CM | POA: Diagnosis present

## 2019-04-09 NOTE — Telephone Encounter (Signed)
Spoke to the patient. New insurance BCBS [id number], [employer], added to Epic demographics. Patient received calls from Pre-admit testing and the [facility] today. Patient said her COVID testing has been moved to tomorrow morning.

## 2019-04-09 NOTE — Patient Instructions (Signed)
Your procedure is scheduled on: Tues 12/29 Report to Day Surgery. To find out your arrival time please call [phone] between 1PM - 3PM on Mon 12/28.  Remember: Instructions that are not followed completely may result in serious medical risk,  up to and including death, or upon the discretion of your surgeon and anesthesiologist your  surgery may need to be rescheduled.     _X__ 1. Do not eat food after midnight the night before your procedure.                 No gum chewing or hard candies. You may drink clear liquids up to 2 hours                 before you are scheduled to arrive for your surgery- DO not drink clear                 liquids within 2 hours of the start of your surgery.                 Clear Liquids include:  water, apple juice without pulp, clear carbohydrate                 drink such as Clearfast of Gatorade, Black Coffee or Tea (Do not add                 anything to coffee or tea).  __X__2.  On the morning of surgery brush your teeth with toothpaste and water, you                may rinse your mouth with mouthwash if you wish.  Do not swallow any toothpaste of mouthwash.     _X__ 3.  No Alcohol for 24 hours before or after surgery.   ___ 4.  Do Not Smoke or use e-cigarettes For 24 Hours Prior to Your Surgery.                 Do not use any chewable tobacco products for at least 6 hours prior to                 surgery.  ____  5.  Bring all medications with you on the day of surgery if instructed.   __x__  6.  Notify your doctor if there is any change in your medical condition      (cold, fever, infections).     Do not wear jewelry, make-up, hairpins, clips or nail polish. Do not wear lotions, powders, or perfumes. You may wear deodorant. Do not shave 48 hours prior to surgery. Men may shave face and neck. Do not bring valuables to the hospital.    [facility] is not responsible for any belongings or valuables.  Contacts, dentures  or bridgework may not be worn into surgery. Leave your suitcase in the car. After surgery it may be brought to your room. For patients admitted to the hospital, discharge time is determined by your treatment team.   Patients discharged the day of surgery will not be allowed to drive home.   Please read over the following fact sheets that you were given:     ____ Take these medicines the morning of surgery with A SIP OF WATER:    1. none  2.   3.   4.  5.  6.  ____ Fleet Enema (as directed)   _x___ Use CHG Soap as directed  ____ Use inhalers on the day  of surgery  ____ Stop metformin 2 days prior to surgery    ____ Take 1/2 of usual insulin dose the night before surgery. No insulin the morning          of surgery.   ____ Stop Coumadin/Plavix/aspirin on  ____ Stop Anti-inflammatories on    ____ Stop supplements until after surgery.    ____ Bring C-Pap to the hospital.   X  Incentive spirometry

## 2019-04-10 ENCOUNTER — Other Ambulatory Visit
Admission: RE | Admit: 2019-04-10 | Discharge: 2019-04-10 | Disposition: A | Discharge status: Home / Self Care | Payer: BC Managed Care – PPO | Source: Ambulatory Visit | Attending: Obstetrics & Gynecology | Admitting: Obstetrics & Gynecology

## 2019-04-10 ENCOUNTER — Telehealth: Payer: Self-pay | Admitting: Obstetrics & Gynecology

## 2019-04-10 DIAGNOSIS — Z01812 Encounter for preprocedural laboratory examination: Secondary | ICD-10-CM | POA: Insufficient documentation

## 2019-04-10 DIAGNOSIS — Z20828 Contact with and (suspected) exposure to other viral communicable diseases: Secondary | ICD-10-CM | POA: Diagnosis not present

## 2019-04-10 LAB — TYPE AND SCREEN
ABO/RH(D): A NEG
Antibody Screen: NEGATIVE

## 2019-04-10 LAB — CBC
HCT: 35.5 % — ABNORMAL LOW (ref 36.0–46.0)
Hemoglobin: 12.1 g/dL (ref 12.0–15.0)
MCH: 27.7 pg (ref 26.0–34.0)
MCHC: 34.1 g/dL (ref 30.0–36.0)
MCV: 81.2 fL (ref 80.0–100.0)
Platelets: 202 10*3/uL (ref 150–400)
RBC: 4.37 MIL/uL (ref 3.87–5.11)
RDW: 13 % (ref 11.5–15.5)
WBC: 5.8 10*3/uL (ref 4.0–10.5)
nRBC: 0 % (ref 0.0–0.2)

## 2019-04-10 LAB — SARS CORONAVIRUS 2 (TAT 6-24 HRS): SARS Coronavirus 2: NEGATIVE

## 2019-04-10 NOTE — Telephone Encounter (Signed)
PreOp labs, no concerns Covid testing pending Let her know

## 2019-04-10 NOTE — Telephone Encounter (Signed)
Patient has received results in mychart would like a call back to go over them. Please advise

## 2019-04-11 ENCOUNTER — Other Ambulatory Visit: Payer: BC Managed Care – PPO

## 2019-04-15 ENCOUNTER — Ambulatory Visit: Payer: BC Managed Care – PPO | Admitting: Obstetrics & Gynecology

## 2019-04-15 MED ORDER — SODIUM CHLORIDE 0.9 % IV SOLN
2.0000 g | INTRAVENOUS | Status: AC
  Administered 2019-04-16: 2 g via INTRAVENOUS

## 2019-04-16 ENCOUNTER — Ambulatory Visit: Payer: BC Managed Care – PPO | Admitting: Anesthesiology

## 2019-04-16 ENCOUNTER — Ambulatory Visit
Admission: RE | Admit: 2019-04-16 | Discharge: 2019-04-16 | Disposition: A | Discharge status: Home / Self Care | Payer: BC Managed Care – PPO | Attending: Obstetrics & Gynecology | Admitting: Obstetrics & Gynecology

## 2019-04-16 ENCOUNTER — Other Ambulatory Visit: Payer: Self-pay

## 2019-04-16 ENCOUNTER — Encounter
Admission: RE | Disposition: A | Discharge status: Home / Self Care | Payer: Self-pay | Source: Home / Self Care | Attending: Obstetrics & Gynecology

## 2019-04-16 ENCOUNTER — Encounter: Payer: Self-pay | Admitting: Obstetrics & Gynecology

## 2019-04-16 DIAGNOSIS — D069 Carcinoma in situ of cervix, unspecified: Secondary | ICD-10-CM | POA: Diagnosis present

## 2019-04-16 DIAGNOSIS — N802 Endometriosis of fallopian tube: Secondary | ICD-10-CM | POA: Diagnosis not present

## 2019-04-16 DIAGNOSIS — Z87891 Personal history of nicotine dependence: Secondary | ICD-10-CM | POA: Diagnosis not present

## 2019-04-16 HISTORY — PX: CYSTOSCOPY: SHX5120

## 2019-04-16 HISTORY — PX: TOTAL LAPAROSCOPIC HYSTERECTOMY WITH SALPINGECTOMY: SHX6742

## 2019-04-16 LAB — POCT PREGNANCY, URINE: Preg Test, Ur: NEGATIVE

## 2019-04-16 SURGERY — HYSTERECTOMY, TOTAL, LAPAROSCOPIC, WITH SALPINGECTOMY
Anesthesia: General

## 2019-04-16 MED ORDER — HEMOSTATIC AGENTS (NO CHARGE) OPTIME
TOPICAL | Status: DC | PRN
  Administered 2019-04-16: 1 via TOPICAL

## 2019-04-16 MED ORDER — SEVOFLURANE IN SOLN
RESPIRATORY_TRACT | Status: AC
  Filled 2019-04-16: qty 250

## 2019-04-16 MED ORDER — MIDAZOLAM HCL 2 MG/2ML IJ SOLN
INTRAMUSCULAR | Status: AC
  Filled 2019-04-16: qty 2

## 2019-04-16 MED ORDER — FENTANYL CITRATE (PF) 100 MCG/2ML IJ SOLN
INTRAMUSCULAR | Status: AC
  Filled 2019-04-16: qty 2

## 2019-04-16 MED ORDER — ONDANSETRON HCL 4 MG/2ML IJ SOLN
INTRAMUSCULAR | Status: DC | PRN
  Administered 2019-04-16: 4 mg via INTRAVENOUS

## 2019-04-16 MED ORDER — DEXAMETHASONE SODIUM PHOSPHATE 10 MG/ML IJ SOLN
INTRAMUSCULAR | Status: DC | PRN
  Administered 2019-04-16: 10 mg via INTRAVENOUS

## 2019-04-16 MED ORDER — OXYCODONE HCL 5 MG PO TABS
ORAL_TABLET | ORAL | Status: AC
  Administered 2019-04-16: 5 mg via ORAL
  Filled 2019-04-16: qty 1

## 2019-04-16 MED ORDER — OXYCODONE HCL 5 MG/5ML PO SOLN: 5.0000 mg | Freq: Once | ORAL | Status: AC | PRN

## 2019-04-16 MED ORDER — OXYCODONE HCL 5 MG PO TABS: 5.0000 mg | ORAL_TABLET | Freq: Once | ORAL | Status: AC | PRN

## 2019-04-16 MED ORDER — PROPOFOL 500 MG/50ML IV EMUL
INTRAVENOUS | Status: AC
  Filled 2019-04-16: qty 50

## 2019-04-16 MED ORDER — FENTANYL CITRATE (PF) 100 MCG/2ML IJ SOLN
INTRAMUSCULAR | Status: AC
  Administered 2019-04-16: 25 ug via INTRAVENOUS
  Filled 2019-04-16: qty 2

## 2019-04-16 MED ORDER — ACETAMINOPHEN 650 MG RE SUPP
650.0000 mg | RECTAL | Status: DC | PRN
  Filled 2019-04-16: qty 1

## 2019-04-16 MED ORDER — SODIUM CHLORIDE 0.9 % IV SOLN
INTRAVENOUS | Status: AC
  Filled 2019-04-16: qty 2

## 2019-04-16 MED ORDER — ACETAMINOPHEN 10 MG/ML IV SOLN
INTRAVENOUS | Status: DC | PRN
  Administered 2019-04-16: 1000 mg via INTRAVENOUS

## 2019-04-16 MED ORDER — BUPIVACAINE HCL (PF) 0.5 % IJ SOLN
INTRAMUSCULAR | Status: AC
  Filled 2019-04-16: qty 30

## 2019-04-16 MED ORDER — FENTANYL CITRATE (PF) 100 MCG/2ML IJ SOLN
25.0000 ug | INTRAMUSCULAR | Status: AC | PRN
  Administered 2019-04-16 (×4): 25 ug via INTRAVENOUS

## 2019-04-16 MED ORDER — ROCURONIUM BROMIDE 100 MG/10ML IV SOLN
INTRAVENOUS | Status: DC | PRN
  Administered 2019-04-16: 50 mg via INTRAVENOUS

## 2019-04-16 MED ORDER — FAMOTIDINE 20 MG PO TABS: 20.0000 mg | ORAL_TABLET | Freq: Once | ORAL | Status: AC

## 2019-04-16 MED ORDER — FENTANYL CITRATE (PF) 100 MCG/2ML IJ SOLN
INTRAMUSCULAR | Status: DC | PRN
  Administered 2019-04-16: 100 ug via INTRAVENOUS

## 2019-04-16 MED ORDER — LIDOCAINE HCL (CARDIAC) PF 100 MG/5ML IV SOSY
PREFILLED_SYRINGE | INTRAVENOUS | Status: DC | PRN
  Administered 2019-04-16: 90 mg via INTRAVENOUS

## 2019-04-16 MED ORDER — PROPOFOL 10 MG/ML IV BOLUS
INTRAVENOUS | Status: DC | PRN
  Administered 2019-04-16: 140 mg via INTRAVENOUS

## 2019-04-16 MED ORDER — ACETAMINOPHEN 325 MG PO TABS: 650.0000 mg | ORAL_TABLET | ORAL | Status: DC | PRN

## 2019-04-16 MED ORDER — MIDAZOLAM HCL 2 MG/2ML IJ SOLN
INTRAMUSCULAR | Status: DC | PRN
  Administered 2019-04-16: 2 mg via INTRAVENOUS

## 2019-04-16 MED ORDER — MORPHINE SULFATE (PF) 4 MG/ML IV SOLN: 1.0000 mg | INTRAVENOUS | Status: DC | PRN

## 2019-04-16 MED ORDER — LACTATED RINGERS IV SOLN: INTRAVENOUS | Status: DC

## 2019-04-16 MED ORDER — SUGAMMADEX SODIUM 200 MG/2ML IV SOLN
INTRAVENOUS | Status: DC | PRN
  Administered 2019-04-16: 200 mg via INTRAVENOUS

## 2019-04-16 MED ORDER — LACTATED RINGERS IV SOLN: INTRAVENOUS | Status: DC | PRN

## 2019-04-16 MED ORDER — OXYCODONE-ACETAMINOPHEN 5-325 MG PO TABS: 1.0000 | ORAL_TABLET | ORAL | 0 refills | Status: DC | PRN

## 2019-04-16 MED ORDER — BUPIVACAINE HCL (PF) 0.5 % IJ SOLN
INTRAMUSCULAR | Status: DC | PRN
  Administered 2019-04-16: 11 mL

## 2019-04-16 MED ORDER — FAMOTIDINE 20 MG PO TABS
ORAL_TABLET | ORAL | Status: AC
  Administered 2019-04-16: 09:00:00 20 mg via ORAL
  Filled 2019-04-16: qty 1

## 2019-04-16 MED ORDER — KETOROLAC TROMETHAMINE 30 MG/ML IJ SOLN
INTRAMUSCULAR | Status: DC | PRN
  Administered 2019-04-16: 30 mg via INTRAVENOUS

## 2019-04-16 MED ORDER — FENTANYL CITRATE (PF) 100 MCG/2ML IJ SOLN
25.0000 ug | Freq: Once | INTRAMUSCULAR | Status: AC
  Administered 2019-04-16: 25 ug via INTRAVENOUS

## 2019-04-16 MED ORDER — OXYCODONE-ACETAMINOPHEN 5-325 MG PO TABS: 1.0000 | ORAL_TABLET | ORAL | Status: DC | PRN

## 2019-04-16 SURGICAL SUPPLY — 53 items
APPLICATOR ARISTA FLEXITIP XL (MISCELLANEOUS) ×4 IMPLANT
BAG URINE DRAIN 2000ML AR STRL (UROLOGICAL SUPPLIES) ×4 IMPLANT
BLADE SURG SZ11 CARB STEEL (BLADE) ×4 IMPLANT
CANISTER SUCT 1200ML W/VALVE (MISCELLANEOUS) ×4 IMPLANT
CATH FOLEY 2WAY  5CC 16FR (CATHETERS) ×2
CATH URTH 16FR FL 2W BLN LF (CATHETERS) ×2 IMPLANT
CHLORAPREP W/TINT 26 (MISCELLANEOUS) ×4 IMPLANT
COVER WAND RF STERILE (DRAPES) ×4 IMPLANT
DEFOGGER SCOPE WARMER CLEARIFY (MISCELLANEOUS) ×4 IMPLANT
DERMABOND ADVANCED (GAUZE/BANDAGES/DRESSINGS) ×2
DERMABOND ADVANCED .7 DNX12 (GAUZE/BANDAGES/DRESSINGS) ×2 IMPLANT
DEVICE SUTURE ENDOST 10MM (ENDOMECHANICALS) ×4 IMPLANT
DRAPE CAMERA CLOSED 9X96 (DRAPES) ×4 IMPLANT
DRSG TEGADERM 2-3/8X2-3/4 SM (GAUZE/BANDAGES/DRESSINGS) IMPLANT
GLOVE BIO SURGEON STRL SZ8 (GLOVE) ×20 IMPLANT
GLOVE INDICATOR 8.0 STRL GRN (GLOVE) ×32 IMPLANT
GOWN STRL REUS W/ TWL LRG LVL3 (GOWN DISPOSABLE) ×2 IMPLANT
GOWN STRL REUS W/ TWL XL LVL3 (GOWN DISPOSABLE) ×4 IMPLANT
GOWN STRL REUS W/TWL LRG LVL3 (GOWN DISPOSABLE) ×2
GOWN STRL REUS W/TWL XL LVL3 (GOWN DISPOSABLE) ×4
GRASPER SUT TROCAR 14GX15 (MISCELLANEOUS) ×4 IMPLANT
HEMOSTAT ARISTA ABSORB 1G (HEMOSTASIS) ×4 IMPLANT
IRRIGATION STRYKERFLOW (MISCELLANEOUS) ×2 IMPLANT
IRRIGATOR STRYKERFLOW (MISCELLANEOUS) ×4
IV LACTATED RINGERS 1000ML (IV SOLUTION) ×16 IMPLANT
KIT PINK PAD W/HEAD ARE REST (MISCELLANEOUS) ×4
KIT PINK PAD W/HEAD ARM REST (MISCELLANEOUS) ×2 IMPLANT
KIT TURNOVER CYSTO (KITS) ×4 IMPLANT
LABEL OR SOLS (LABEL) ×4 IMPLANT
MANIPULATOR VCARE LG CRV RETR (MISCELLANEOUS) ×4 IMPLANT
MANIPULATOR VCARE SML CRV RETR (MISCELLANEOUS) IMPLANT
MANIPULATOR VCARE STD CRV RETR (MISCELLANEOUS) IMPLANT
NEEDLE VERESS 14GA 120MM (NEEDLE) ×4 IMPLANT
NS IRRIG 500ML POUR BTL (IV SOLUTION) ×4 IMPLANT
OCCLUDER COLPOPNEUMO (BALLOONS) ×4 IMPLANT
PACK GYN LAPAROSCOPIC (MISCELLANEOUS) ×4 IMPLANT
PAD OB MATERNITY 4.3X12.25 (PERSONAL CARE ITEMS) ×4 IMPLANT
PAD PREP 24X41 OB/GYN DISP (PERSONAL CARE ITEMS) ×4 IMPLANT
PORT ACCESS TROCAR AIRSEAL 12 (TROCAR) ×2 IMPLANT
PORT ACCESS TROCAR AIRSEAL 5M (TROCAR) ×2
SCISSORS METZENBAUM CVD 33 (INSTRUMENTS) ×4 IMPLANT
SET CYSTO W/LG BORE CLAMP LF (SET/KITS/TRAYS/PACK) ×4 IMPLANT
SET TRI-LUMEN FLTR TB AIRSEAL (TUBING) ×4 IMPLANT
SHEARS HARMONIC ACE PLUS 36CM (ENDOMECHANICALS) ×4 IMPLANT
SLEEVE ENDOPATH XCEL 5M (ENDOMECHANICALS) ×4 IMPLANT
SPONGE GAUZE 2X2 8PLY STER LF (GAUZE/BANDAGES/DRESSINGS) ×3
SPONGE GAUZE 2X2 8PLY STRL LF (GAUZE/BANDAGES/DRESSINGS) ×9 IMPLANT
SUT ENDO VLOC 180-0-8IN (SUTURE) ×4 IMPLANT
SUT VIC AB 0 CT1 36 (SUTURE) ×4 IMPLANT
SUT VIC AB 4-0 FS2 27 (SUTURE) ×4 IMPLANT
SYR 10ML LL (SYRINGE) ×4 IMPLANT
SYR 50ML LL SCALE MARK (SYRINGE) ×4 IMPLANT
TROCAR XCEL NON-BLD 5MMX100MML (ENDOMECHANICALS) ×4 IMPLANT

## 2019-04-16 NOTE — Anesthesia Preprocedure Evaluation (Signed)
Anesthesia Evaluation  Patient identified by MRN, date of birth, ID band Patient awake    Reviewed: Allergy & Precautions, H&P , NPO status , Patient's Chart, lab work & pertinent test results  History of Anesthesia Complications Negative for: history of anesthetic complications  Airway Mallampati: III  TM Distance: >3 FB Neck ROM: full    Dental  (+) Chipped   Pulmonary neg pulmonary ROS, neg shortness of breath, former smoker,           Cardiovascular Exercise Tolerance: Good hypertension, (-) angina(-) Past MI and (-) DOE      Neuro/Psych negative neurological ROS  negative psych ROS   GI/Hepatic negative GI ROS, Neg liver ROS,   Endo/Other  negative endocrine ROS  Renal/GU      Musculoskeletal   Abdominal   Peds  Hematology negative hematology ROS (+)   Anesthesia Other Findings Past Medical History: No date: Anemia     Comment:  H/O No date: Heart murmur     Comment:  ASYMPTOMATIC No date: Pregnancy induced hypertension  Past Surgical History: No date: CERVICAL BIOPSY  W/ LOOP ELECTRODE EXCISION 2020: CERVICAL BIOPSY  W/ LOOP ELECTRODE EXCISION 04/29/2016: LAPAROSCOPIC TUBAL LIGATION; Bilateral     Comment:  Procedure: LAPAROSCOPIC TUBAL LIGATION;  Surgeon:               Malachy Mood, MD;  Location: [facility] ORS;  Service:               Gynecology;  Laterality: Bilateral; No date: NO PAST SURGERIES     Reproductive/Obstetrics negative OB ROS                             Anesthesia Physical Anesthesia Plan  ASA: II  Anesthesia Plan: General ETT   Post-op Pain Management:    Induction: Intravenous  PONV Risk Score and Plan: Ondansetron, Dexamethasone, Midazolam and Treatment may vary due to age or medical condition  Airway Management Planned: Oral ETT  Additional Equipment:   Intra-op Plan:   Post-operative Plan: Extubation in OR  Informed Consent: I have  reviewed the patients History and Physical, chart, labs and discussed the procedure including the risks, benefits and alternatives for the proposed anesthesia with the patient or authorized representative who has indicated his/her understanding and acceptance.     Dental Advisory Given  Plan Discussed with: Anesthesiologist, CRNA and Surgeon  Anesthesia Plan Comments: (Patient consented for risks of anesthesia including but not limited to:  - adverse reactions to medications - damage to teeth, lips or other oral mucosa - sore throat or hoarseness - Damage to heart, brain, lungs or loss of life  Patient voiced understanding.)        Anesthesia Quick Evaluation

## 2019-04-16 NOTE — Discharge Instructions (Signed)

## 2019-04-16 NOTE — Transfer of Care (Signed)
Immediate Anesthesia Transfer of Care Note  Patient: Stefanie Mason  Procedure(s) Performed: TOTAL LAPAROSCOPIC HYSTERECTOMY WITH BILATERAL SALPINGECTOMY (N/A ) CYSTOSCOPY  Patient Location: PACU  Anesthesia Type:General  Level of Consciousness: drowsy  Airway & Oxygen Therapy: Patient Spontanous Breathing and Patient connected to face mask oxygen  Post-op Assessment: Report given to RN  Post vital signs: Reviewed and stable  Last Vitals:  Vitals Value Taken Time  BP    Temp    Pulse 69 04/16/19 1242  Resp 12 04/16/19 1242  SpO2 100 % 04/16/19 1242  Vitals shown include unvalidated device data.  Last Pain:  Vitals:   04/16/19 0816  TempSrc: Tympanic  PainSc: 0-No pain         Complications: No apparent anesthesia complications

## 2019-04-16 NOTE — Anesthesia Procedure Notes (Signed)
Procedure Name: Intubation Date/Time: 04/16/2019 10:48 AM Performed by: Justus Memory, CRNA Pre-anesthesia Checklist: Patient identified, Patient being monitored, Timeout performed, Emergency Drugs available and Suction available Patient Re-evaluated:Patient Re-evaluated prior to induction Oxygen Delivery Method: Circle system utilized Preoxygenation: Pre-oxygenation with 100% oxygen Induction Type: IV induction Ventilation: Mask ventilation without difficulty Laryngoscope Size: Mac and 3 Grade View: Grade I Tube type: Oral Tube size: 7.0 mm Number of attempts: 1 Airway Equipment and Method: Stylet and Video-laryngoscopy Placement Confirmation: ETT inserted through vocal cords under direct vision,  positive ETCO2 and breath sounds checked- equal and bilateral Secured at: 21 cm Tube secured with: Tape Dental Injury: Teeth and Oropharynx as per pre-operative assessment  Difficulty Due To: Difficulty was anticipated and Difficult Airway- due to anterior larynx Future Recommendations: Recommend- induction with short-acting agent, and alternative techniques readily available

## 2019-04-16 NOTE — Op Note (Signed)
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  ANESTHESIA: General endotracheal anesthesia  ESTIMATED BLOOD LOSS: 50 mL  SPECIMENS: Uterus, Tubes.  COMPLICATIONS: None  DISPOSITION: stable to PACU  FINDINGS: Intraabdominal adhesions were not noted.   PROCEDURE:  The patient was taken to the OR where anesthesia was administed. She was prepped and draped in the normal sterile fashion in the dorsal lithotomy position in the Ingalls stirrups. A time out was performed. A Graves speculum was inserted, the cervix was grasped with a single tooth tenaculum and the endometrial cavity was sounded. The cervix was progressively dilated to a size 18 [place] with [employer] dilators. A V-Care uterine manipulator was inserted in the usual fashion without incident. Gloves were changed and attention was turned to the abdomen.   An infraumbilical transverse 91mm skin incision was made with the scalpel after local anesthesia applied to the skin. A Veress-step needle was inserted in the usual fashion and confirmed using the hanging drop technique. A pneumoperitoneum was obtained by insufflation of CO2 (opening pressure of ) to . A diagnostic laparoscopy was performed yielding the previously described findings. Attention was turned to the left lower quadrant where after visualization of the inferior epigastric vessels a 45mm skin incision was made with the scalpel. A 5 mm laparoscopic port was inserted. The same procedure was repeated in the right lower quadrant with a 4mm trocar. Attention was turned to the left aspect of the uterus, where after visualization of the ureter, the round ligament  was coagulated and transected using the 81mm Harmonic Scapel. The anterior and posterior leafs of the broad ligament were dissected off as the anterior one was coagulated and transected in a caudal direction towards the cuff of the uterine manipulator.  Attention was then turned to the left fallopian tube which was recognized by visualization of the fimbria. The tube is excised to its attachment to the uterus. The uterine-ovarian ligament and its blood vessels were carefully coagulated and transected using the Harmonic scapel. The assistance of my physician assistant was vital to resect and retract interchangably with self on each side.   Attention was turned to the right aspect of the uterus where the same procedure was performed.  The vesicouterine reflection of the peritoneum was dissected with the harmonic scapel and the bladder flap was created bluntly.  The uterine vessels were coagulated and transected bilaterally using first bipolar cautery and then the harmonic scapel. A 360 degree, circumferential colpotomy was done to completely amputate the uterus with cervix and tubes. Once the specimen was amputated it was delivered through the vagina.   The colpotomy was repaired in a simple running fashion using a delayed absorbable suture with an endo-stitch device.  Vaginal exam confirms complete closure.  The cavity was copiously irrigated. A survey of the pelvic cavity revealed adequate hemostasis and no injury to bowel, bladder, or ureter.   A diagnostic cystoscopy was performed using saline distension of bladder with no lesions or injuries noted.  Bilateral urine flow from each ureteral orifice is visualized.  At this point the procedure was finalized. Right lower quadrant fascia incision is closed with a vicryl suture using the fascia closure device. All the instruments were removed from the patient's body. Gas was expelled and  patient is leveled.  Incisions are closed with skin adhesive.    Patient  goes to recovery room in stable condition.  All sponge, instrument, and needle counts are correct x2.     Barnett Applebaum, MD, [facility], [facility] 04/16/2019  12:27 PM

## 2019-04-16 NOTE — Anesthesia Post-op Follow-up Note (Signed)
Anesthesia QCDR form completed.        

## 2019-04-16 NOTE — Anesthesia Postprocedure Evaluation (Signed)
Anesthesia Post Note  Patient: Best boy  Procedure(s) Performed: TOTAL LAPAROSCOPIC HYSTERECTOMY WITH BILATERAL SALPINGECTOMY (N/A ) CYSTOSCOPY  Patient location during evaluation: PACU Anesthesia Type: General Level of consciousness: awake and alert Pain management: pain level controlled Vital Signs Assessment: post-procedure vital signs reviewed and stable Respiratory status: spontaneous breathing, nonlabored ventilation, respiratory function stable and patient connected to nasal cannula oxygen Cardiovascular status: blood pressure returned to baseline and stable Postop Assessment: no apparent nausea or vomiting Anesthetic complications: no     Last Vitals:  Vitals:   04/16/19 1342 04/16/19 1357  BP: 128/86 124/82  Pulse: 69 75  Resp: 11 11  Temp:    SpO2: 98% 97%    Last Pain:  Vitals:   04/16/19 1357  TempSrc:   PainSc: 5                  Precious Haws Kalyn Hofstra

## 2019-04-16 NOTE — Interval H&P Note (Signed)
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  The various methods of treatment have been discussed with the patient and family. After consideration of risks, benefits and other options for treatment, the patient has consented to  Procedure(s): TOTAL LAPAROSCOPIC HYSTERECTOMY WITH BILATERAL SALPINGECTOMY (N/A) as a surgical intervention.  The patient's history has been reviewed, patient examined, no change in status, stable for surgery.  I have reviewed the patient's chart and labs.  Questions were answered to the patient's satisfaction.     Hoyt Koch

## 2019-04-18 ENCOUNTER — Telehealth: Payer: Self-pay

## 2019-04-18 LAB — SURGICAL PATHOLOGY

## 2019-04-18 NOTE — Telephone Encounter (Signed)
Pt aware.

## 2019-04-18 NOTE — Telephone Encounter (Signed)
Colace or even Dulcolax as needed for BM Ibuprofen instead of Percocet for pain, as needed

## 2019-04-18 NOTE — Telephone Encounter (Signed)
Pt calling; has hyst 12/29; has questions regarding pain management and what she should or should not be feeling.  [phone]

## 2019-04-18 NOTE — Telephone Encounter (Signed)
Pt states she feels constipated, I advised her its probably from the pain med's. I advised her to take a stool softener and she's states she is at the point she feels like she don't need the pain med's, wondering if the dull pain she feels at the bottom of her stomach is from not being able to have a BM. I agreed and told her I would send this message to [facility] just to make sure this was nothing to worry about.

## 2019-04-29 ENCOUNTER — Ambulatory Visit (INDEPENDENT_AMBULATORY_CARE_PROVIDER_SITE_OTHER): Payer: BC Managed Care – PPO | Admitting: Obstetrics & Gynecology

## 2019-04-29 ENCOUNTER — Encounter: Payer: Self-pay | Admitting: Obstetrics & Gynecology

## 2019-04-29 ENCOUNTER — Other Ambulatory Visit: Payer: Self-pay

## 2019-04-29 VITALS — BP 122/80 | Ht 60.0 in | Wt 146.0 lb

## 2019-04-29 DIAGNOSIS — Z90710 Acquired absence of both cervix and uterus: Secondary | ICD-10-CM

## 2019-04-29 NOTE — Progress Notes (Signed)
  Postoperative Follow-up Patient presents post op from [facility] BS for CIN III, 2 weeks ago.  Pathology: DIAGNOSIS:  A. UTERUS AND CERVIX WITH BILATERAL TUBES; HYSTERECTOMY WITH  SALPINGECTOMY:  - CERVIX: HIGH-GRADE SQUAMOUS INTRAEPITHELIAL LESION (HSIL / CIN3).    - NEGATIVE FOR INVASIVE CARCINOMA.  - ENDOMETRIUM: PROLIFERATIVE. NEGATIVE FOR ATYPIA / EIN AND MALIGNANCY.  - MYOMETRIUM AND UTERINE SEROSA: NO SIGNIFICANT PATHOLOGIC ALTERATION.  - LEFT FALLOPIAN TUBE: ENDOMETRIOSIS.  - RIGHT FALLOPIAN TUBE: NO SIGNIFICANT PATHOLOGIC ALTERATION.   Subjective: Patient reports marked improvement in her preop symptoms. Eating a regular diet without difficulty. The patient is not having any pain.  Activity: normal activities of daily living. Patient reports additional symptom's since surgery of None.  Objective: BP 122/80   Ht 5' (1.524 m)   Wt 146 lb (66.2 kg)   LMP 04/16/2019   BMI 28.51 kg/m  Physical Exam Constitutional:      General: She is not in acute distress.    Appearance: She is well-developed.  Cardiovascular:     Rate and Rhythm: Normal rate.  Pulmonary:     Effort: Pulmonary effort is normal.  Abdominal:     General: There is no distension.     Palpations: Abdomen is soft.     Tenderness: There is no abdominal tenderness.     Comments: Incision Healing Well   Musculoskeletal:        General: Normal range of motion.  Neurological:     Mental Status: She is alert and oriented to person, place, and time.     Cranial Nerves: No cranial nerve deficit.  Skin:    General: Skin is warm and dry.    Assessment: s/p :  total laparoscopic hysterectomy with bilateral salpingectomy progressing well  Plan: Patient has done well after surgery with no apparent complications.  I have discussed the post-operative course to date, and the expected progress moving forward.  The patient understands what complications to be concerned about.  I will see the patient in routine follow  up, or sooner if needed.    Activity plan: No heavy lifting.Marland Kitchen  Pelvic rest.  Letitia Libra 04/29/2019, 10:44 AM

## 2019-05-27 ENCOUNTER — Ambulatory Visit (INDEPENDENT_AMBULATORY_CARE_PROVIDER_SITE_OTHER): Payer: BC Managed Care – PPO | Admitting: Obstetrics & Gynecology

## 2019-05-27 ENCOUNTER — Encounter: Payer: Self-pay | Admitting: Obstetrics & Gynecology

## 2019-05-27 ENCOUNTER — Other Ambulatory Visit: Payer: Self-pay

## 2019-05-27 VITALS — BP 120/80 | Ht 60.0 in | Wt 154.0 lb

## 2019-05-27 DIAGNOSIS — Z90710 Acquired absence of both cervix and uterus: Secondary | ICD-10-CM

## 2019-05-27 DIAGNOSIS — D069 Carcinoma in situ of cervix, unspecified: Secondary | ICD-10-CM

## 2019-05-27 DIAGNOSIS — Z48816 Encounter for surgical aftercare following surgery on the genitourinary system: Secondary | ICD-10-CM

## 2019-05-27 DIAGNOSIS — N802 Endometriosis of fallopian tube: Secondary | ICD-10-CM

## 2019-05-27 NOTE — Progress Notes (Signed)
  Postoperative Follow-up Patient presents post op from [facility] BS for CIN III despite LEEP x2, 6 weeks ago.  Subjective: Patient reports marked improvement in her preop symptoms. Eating a regular diet without difficulty. The patient is not having any pain.  Activity: normal activities of daily living. Patient reports additional symptom's since surgery of No bleeding  Objective: BP 120/80   Ht 5' (1.524 m)   Wt 154 lb (69.9 kg)   LMP 04/16/2019   BMI 30.08 kg/m  Physical Exam Constitutional:      General: She is not in acute distress.    Appearance: She is well-developed.  Genitourinary:     Pelvic exam was performed with patient supine.     Vagina and rectum normal.     No vaginal erythema or bleeding.     No right or left adnexal mass present.     Right adnexa not tender.     Left adnexa not tender.     Genitourinary Comments: Cervix and uterus absent. Vaginal cuff healing well w small midline separation a spotting on qtip test  Cardiovascular:     Rate and Rhythm: Normal rate.  Pulmonary:     Effort: Pulmonary effort is normal.  Abdominal:     General: There is no distension.     Palpations: Abdomen is soft.     Tenderness: There is no abdominal tenderness.     Comments: Incision healing well.  Musculoskeletal:        General: Normal range of motion.  Neurological:     Mental Status: She is alert and oriented to person, place, and time.     Cranial Nerves: No cranial nerve deficit.  Skin:    General: Skin is warm and dry.     Assessment: s/p :  total laparoscopic hysterectomy with bilateral salpingectomy progressing well  Plan: Patient has done well after surgery with no apparent complications.  I have discussed the post-operative course to date, and the expected progress moving forward.  The patient understands what complications to be concerned about.  I will see the patient in routine follow up, or sooner if needed.    Activity plan: No restriction.    Monitor  for pain or bleeding w sex    Recheck vag cuff if persisant sx's PAP next year, then every 5 years  Letitia Libra 05/27/2019, 4:15 PM

## 2019-07-11 ENCOUNTER — Encounter: Payer: Self-pay | Admitting: Obstetrics & Gynecology

## 2019-07-11 ENCOUNTER — Other Ambulatory Visit: Payer: Self-pay

## 2019-07-11 ENCOUNTER — Ambulatory Visit (INDEPENDENT_AMBULATORY_CARE_PROVIDER_SITE_OTHER): Payer: BC Managed Care – PPO | Admitting: Obstetrics & Gynecology

## 2019-07-11 VITALS — BP 120/80 | Ht 60.0 in | Wt 153.0 lb

## 2019-07-11 DIAGNOSIS — M545 Low back pain, unspecified: Secondary | ICD-10-CM

## 2019-07-11 NOTE — Progress Notes (Signed)
  Pt is a 34 yo WF with some lower back pain, usually midline with some radiation to the pelvic and hips.  No VB.  No dyspareunia.  Prior TLH for CIS 12 weeks ago, recovered well.  No vasomotor sx's.  Has seen Chiropractor and feels she had some answers for her back pain and plan in place for intervention. No nerve pains in Legs or feet.  Normal gait and strength.  Works without diffuclty.    PMHx: She  has a past medical history of Anemia, Heart murmur, and Pregnancy induced hypertension. Also,  has a past surgical history that includes Laparoscopic tubal ligation (Bilateral, 04/29/2016); Cervical biopsy w/ loop electrode excision; Cervical biopsy w/ loop electrode excision (2020); No past surgeries; Total laparoscopic hysterectomy with salpingectomy (N/A, 04/16/2019); and Cystoscopy (04/16/2019)., family history includes Diabetes in her maternal grandmother; Heart disease in her maternal grandmother; Stroke in her mother.,  reports that she quit smoking about 9 years ago. Her smoking use included cigarettes. She has a 1.00 pack-year smoking history. She has never used smokeless tobacco. She reports current alcohol use. She reports that she does not use drugs.  She has a current medication list which includes the following prescription(s): oxycodone-acetaminophen. Also, is allergic to codeine.  Review of Systems  All other systems reviewed and are negative.   Objective: BP 120/80   Ht 5' (1.524 m)   Wt 153 lb (69.4 kg)   LMP 04/16/2019   BMI 29.88 kg/m  Physical Exam Constitutional:      General: She is not in acute distress.    Appearance: She is well-developed.  Musculoskeletal:        General: Normal range of motion.  Neurological:     Mental Status: She is alert and oriented to person, place, and time.  Skin:    General: Skin is warm and dry.  Vitals reviewed.   No CVAT  ASSESSMENT/PLAN:     ICD-10-CM   1. Acute midline low back pain without sciatica  M54.5   No evidence for  back pain related to hysterectomy Healing well, pos sexuality without neg effects PAP next annual visit due to h/o cervical dysplasia prior to hysterectomy  Annamarie Major, MD, [facility], [facility] 07/11/2019  4:38 PM

## 2019-08-03 ENCOUNTER — Other Ambulatory Visit: Payer: Self-pay

## 2019-08-03 ENCOUNTER — Encounter: Payer: Self-pay | Admitting: Emergency Medicine

## 2019-08-03 ENCOUNTER — Emergency Department
Admission: EM | Admit: 2019-08-03 | Discharge: 2019-08-03 | Disposition: A | Discharge status: Home / Self Care | Payer: BC Managed Care – PPO | Attending: Emergency Medicine | Admitting: Emergency Medicine

## 2019-08-03 ENCOUNTER — Emergency Department: Payer: BC Managed Care – PPO

## 2019-08-03 DIAGNOSIS — Z79899 Other long term (current) drug therapy: Secondary | ICD-10-CM | POA: Diagnosis not present

## 2019-08-03 DIAGNOSIS — M546 Pain in thoracic spine: Secondary | ICD-10-CM | POA: Insufficient documentation

## 2019-08-03 DIAGNOSIS — Z87891 Personal history of nicotine dependence: Secondary | ICD-10-CM | POA: Diagnosis not present

## 2019-08-03 DIAGNOSIS — M549 Dorsalgia, unspecified: Secondary | ICD-10-CM

## 2019-08-03 HISTORY — DX: Dorsalgia, unspecified: M54.9

## 2019-08-03 MED ORDER — CYCLOBENZAPRINE HCL 10 MG PO TABS: 10.0000 mg | ORAL_TABLET | Freq: Three times a day (TID) | ORAL | 0 refills | Status: DC | PRN

## 2019-08-03 MED ORDER — HYDROMORPHONE HCL 1 MG/ML IJ SOLN
1.0000 mg | Freq: Once | INTRAMUSCULAR | Status: AC
  Administered 2019-08-03: 1 mg via INTRAMUSCULAR
  Filled 2019-08-03: qty 1

## 2019-08-03 MED ORDER — TRAMADOL HCL 50 MG PO TABS: 50.0000 mg | ORAL_TABLET | Freq: Four times a day (QID) | ORAL | 0 refills | Status: DC | PRN

## 2019-08-03 MED ORDER — DEXAMETHASONE SODIUM PHOSPHATE 10 MG/ML IJ SOLN
10.0000 mg | Freq: Once | INTRAMUSCULAR | Status: AC
  Administered 2019-08-03: 10 mg via INTRAMUSCULAR
  Filled 2019-08-03: qty 1

## 2019-08-03 MED ORDER — ORPHENADRINE CITRATE 30 MG/ML IJ SOLN
60.0000 mg | Freq: Two times a day (BID) | INTRAMUSCULAR | Status: DC
  Administered 2019-08-03: 12:00:00 60 mg via INTRAMUSCULAR
  Filled 2019-08-03: qty 2

## 2019-08-03 NOTE — ED Notes (Signed)
First Nurse Note: Pt to ED via POV c/o back pain. Pt is in NAD.  

## 2019-08-03 NOTE — Discharge Instructions (Signed)
Follow discharge care instruction take medication as directed.  Be advised medication may cause drowsiness. 

## 2019-08-03 NOTE — ED Triage Notes (Signed)
Pt arrived via POV with c/o back pain, states has hx of chronic back pain, recently started seeing chiropractor, states she has pinched nerve in her back.  Pt states yesterday at work she had a flare-up and was asked to go home due to her pain. Pt states she finished the day at work.  Pt states she thinks the back pain could be her sciatic back pain.  Pt c/o lower back pain, pt states she felt like it locked up. Pt states she used icy hot and ibuprofen with no relief.  Pt states the back pain radiates down the left leg mostly but sometimes the right.

## 2019-08-03 NOTE — ED Provider Notes (Signed)
[facility] Emergency Department Provider Note   ____________________________________________   First MD Initiated Contact with Patient 08/03/19 1127     (approximate)  I have reviewed the triage vital signs and the nursing notes.   HISTORY  Chief Complaint Back Pain    HPI Stefanie Mason is a 34 y.o. female patient presents with several days of increasing low back pain.  Patient stated few days ago she had a chiropractor session and was told she had a pinched nerve.  Patient states yesterday at work she had a flareup of back pain and had to leave work secondary to pain.  Patient states this morning she woke up and she feel her back is "locked up".  Patient states this spasmatic pain which is not relieved with icy-ibuprofen.  Patient denies bladder or bowel dysfunction.  Patient rates her pain as a 10/10.         Past Medical History:  Diagnosis Date  . Anemia    H/O  . Back pain   . Heart murmur    ASYMPTOMATIC  . Pregnancy induced hypertension     Patient Active Problem List   Diagnosis Date Noted  . S/P laparoscopic hysterectomy 04/29/2019  . CIN III (cervical intraepithelial neoplasia grade III) with severe dysplasia 01/14/2019  . HGSIL (high grade squamous intraepithelial lesion) on Pap smear of cervix 11/14/2018  . Irregular menses 11/14/2018  . Postpartum care following vaginal delivery 01/14/2016  . Gestational hypertension 01/11/2016  . Labor and delivery, indication for care 10/24/2015    Past Surgical History:  Procedure Laterality Date  . CERVICAL BIOPSY  W/ LOOP ELECTRODE EXCISION    . CERVICAL BIOPSY  W/ LOOP ELECTRODE EXCISION  2020  . CYSTOSCOPY  04/16/2019   Procedure: CYSTOSCOPY;  Surgeon: Gae Dry, MD;  Location: [facility] ORS;  Service: Gynecology;;  . LAPAROSCOPIC TUBAL LIGATION Bilateral 04/29/2016   Procedure: LAPAROSCOPIC TUBAL LIGATION;  Surgeon: Malachy Mood, MD;  Location: [facility] ORS;  Service: Gynecology;   Laterality: Bilateral;  . NO PAST SURGERIES    . TOTAL LAPAROSCOPIC HYSTERECTOMY WITH SALPINGECTOMY N/A 04/16/2019   Procedure: TOTAL LAPAROSCOPIC HYSTERECTOMY WITH BILATERAL SALPINGECTOMY;  Surgeon: Gae Dry, MD;  Location: [facility] ORS;  Service: Gynecology;  Laterality: N/A;    Prior to Admission medications   Medication Sig Start Date End Date Taking? Authorizing Provider  cyclobenzaprine (FLEXERIL) 10 MG tablet Take 1 tablet (10 mg total) by mouth 3 (three) times daily as needed. 08/03/19   Sable Feil, PA-C  oxyCODONE-acetaminophen (PERCOCET/ROXICET) 5-325 MG tablet Take 1 tablet by mouth every 4 (four) hours as needed for moderate pain. Patient not taking: Reported on 04/29/2019 04/16/19   Gae Dry, MD  traMADol (ULTRAM) 50 MG tablet Take 1 tablet (50 mg total) by mouth every 6 (six) hours as needed for moderate pain. 08/03/19   Sable Feil, PA-C    Allergies Codeine  Family History  Problem Relation Age of Onset  . Stroke Mother   . Diabetes Maternal Grandmother   . Heart disease Maternal Grandmother     Social History Social History   Tobacco Use  . Smoking status: Former Smoker    Packs/day: 0.25    Years: 4.00    Pack years: 1.00    Types: Cigarettes    Quit date: 04/25/2010    Years since quitting: 9.2  . Smokeless tobacco: Never Used  Substance Use Topics  . Alcohol use: Yes    Comment: occasional   .  Drug use: No    Review of Systems Constitutional: No fever/chills Eyes: No visual changes. ENT: No sore throat. Cardiovascular: Denies chest pain. Respiratory: Denies shortness of breath. Gastrointestinal: No abdominal pain.  No nausea, no vomiting.  No diarrhea.  No constipation. Genitourinary: Negative for dysuria. Musculoskeletal: Positive for back pain. Skin: Negative for rash. Neurological: Negative for headaches, focal weakness or numbness. Allergic/Immunilogical: Codeine ____________________________________________   PHYSICAL  EXAM:  VITAL SIGNS: ED Triage Vitals  Enc Vitals Group     BP 08/03/19 1115 122/81     Pulse Rate 08/03/19 1115 73     Resp 08/03/19 1115 18     Temp 08/03/19 1115 98.3 F (36.8 C)     Temp Source 08/03/19 1115 Oral     SpO2 08/03/19 1115 100 %     Weight 08/03/19 1122 145 lb (65.8 kg)     Height 08/03/19 1122 5' (1.524 m)     Head Circumference --      Peak Flow --      Pain Score 08/03/19 1122 10     Pain Loc --      Pain Edu? --      Excl. in GC? --    Constitutional: Alert and oriented.  Moderate distress laying on left side.   Neck: o cervical spine tenderness to palpation. Cardiovascular: Normal rate, regular rhythm. Grossly normal heart sounds.  Good peripheral circulation. Respiratory: Normal respiratory effort.  No retractions. Lungs CTAB. Gastrointestinal: Soft and nontender. No distention. No abdominal bruits. No CVA tenderness. Genitourinary: Deferred Musculoskeletal: No lower extremity tenderness nor edema.  No joint effusions.  Patient has negative bilateral straight leg test. Neurologic:  Normal speech and language. No gross focal neurologic deficits are appreciated. No gait instability. Skin:  Skin is warm, dry and intact. No rash noted. Psychiatric: Mood and affect are normal. Speech and behavior are normal.  ____________________________________________   LABS (all labs ordered are listed, but only abnormal results are displayed)  Labs Reviewed - No data to display ____________________________________________  EKG   ____________________________________________  RADIOLOGY  ED MD interpretation:    Official radiology report(s): DG Lumbar Spine 2-3 Views  Result Date: 08/03/2019 CLINICAL DATA:  Radicular back pain extending into the left lower extremity. Chronic back pain. EXAM: LUMBAR SPINE - 2-3 VIEW COMPARISON:  None. 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 No subluxation, fracture or acute bony abnormality. Preserved intervertebral disc height at the remaining levels. IMPRESSION: 1. 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 2. No significant abnormalities are observed in the lumbar spine. Electronically Signed   By: Gaylyn Rong M.D.   On: 08/03/2019 12:18    ____________________________________________   PROCEDURES  Procedure(s) performed (including Critical Care):  Procedures   ____________________________________________   INITIAL IMPRESSION / ASSESSMENT AND PLAN / ED COURSE  As part of my medical decision making, I reviewed the following data within the electronic MEDICAL RECORD NUMBER     Patient presents with 2 to 3 days of back pain.  Discussed x-ray findings with patient revealing congenital narrowing deformity of T12-L1.  Patient given discharge care instruction advised to follow-up with treating chiropractor.  Patient given a prescription for tramadol and Flexeril.    Stefanie Mason was evaluated in Emergency Department on 08/03/2019 for the symptoms described in the history of present illness. She was evaluated in the context of the global COVID-19  pandemic, which necessitated consideration that the patient might be at risk for infection with the SARS-CoV-2 virus that causes COVID-19. Institutional protocols and algorithms that pertain to the evaluation of patients at risk for COVID-19 are in a state of rapid change based on information released by regulatory bodies including the CDC and federal and state organizations. These policies and algorithms were followed during the patient's care in the ED.       ____________________________________________   FINAL CLINICAL IMPRESSION(S) / ED DIAGNOSES  Final diagnoses:  Pain, upper back     ED Discharge Orders         Ordered    cyclobenzaprine (FLEXERIL) 10 MG tablet  3 times daily PRN     08/03/19 1229    traMADol  (ULTRAM) 50 MG tablet  Every 6 hours PRN     08/03/19 1229           Note:  This document was prepared using Dragon voice recognition software and may include unintentional dictation errors.    Joni Reining, PA-C 08/03/19 1232    Minna Antis, MD 08/03/19 1515

## 2019-10-21 ENCOUNTER — Emergency Department
Admission: EM | Admit: 2019-10-21 | Discharge: 2019-10-21 | Disposition: A | Discharge status: Home / Self Care | Payer: BC Managed Care – PPO | Attending: Emergency Medicine | Admitting: Emergency Medicine

## 2019-10-21 ENCOUNTER — Other Ambulatory Visit: Payer: Self-pay

## 2019-10-21 ENCOUNTER — Encounter: Payer: Self-pay | Admitting: Emergency Medicine

## 2019-10-21 DIAGNOSIS — R21 Rash and other nonspecific skin eruption: Secondary | ICD-10-CM | POA: Diagnosis not present

## 2019-10-21 DIAGNOSIS — Z87891 Personal history of nicotine dependence: Secondary | ICD-10-CM | POA: Insufficient documentation

## 2019-10-21 MED ORDER — PREDNISONE 10 MG PO TABS: ORAL_TABLET | ORAL | 0 refills | Status: DC

## 2019-10-21 MED ORDER — ACYCLOVIR 800 MG PO TABS: 800.0000 mg | ORAL_TABLET | Freq: Every day | ORAL | 0 refills | Status: AC

## 2019-10-21 NOTE — ED Triage Notes (Signed)
Patient presents to the ED with a rash on the back of her neck that she noticed this morning. Patient states area is "itchy and burns".  Patient is in no obvious distress at this time.

## 2019-10-21 NOTE — ED Notes (Signed)
See triage note presents with rash to back of neck  States noticed rash this am

## 2019-10-21 NOTE — ED Provider Notes (Signed)
[facility] Emergency Department Provider Note  ____________________________________________  Time seen: Approximately 1:09 PM  I have reviewed the triage vital signs and the nursing notes.   HISTORY  Chief Complaint Rash    HPI Stefanie Mason is a 34 y.o. female that presents to the emergency department for evaluation of burning pain, itching, rash to right neck this morning. Pain is in her hairline. She describes pain as burning, tingling, and itching. There is some redness. No new shampoos or soaps.  She denies any contact with poison oak or poison ivy.  She has not recently dyed her hair.  She has had chicken pox. She is not having any difficulty breathing or swallowing. No fevers.  No headache, shortness breath, chest pain.   Past Medical History:  Diagnosis Date  . Anemia    H/O  . Back pain   . Heart murmur    ASYMPTOMATIC  . Pregnancy induced hypertension     Patient Active Problem List   Diagnosis Date Noted  . S/P laparoscopic hysterectomy 04/29/2019  . CIN III (cervical intraepithelial neoplasia grade III) with severe dysplasia 01/14/2019  . HGSIL (high grade squamous intraepithelial lesion) on Pap smear of cervix 11/14/2018  . Irregular menses 11/14/2018  . Postpartum care following vaginal delivery 01/14/2016  . Gestational hypertension 01/11/2016  . Labor and delivery, indication for care 10/24/2015    Past Surgical History:  Procedure Laterality Date  . CERVICAL BIOPSY  W/ LOOP ELECTRODE EXCISION    . CERVICAL BIOPSY  W/ LOOP ELECTRODE EXCISION  2020  . CYSTOSCOPY  04/16/2019   Procedure: CYSTOSCOPY;  Surgeon: Nadara Mustard, MD;  Location: [facility] ORS;  Service: Gynecology;;  . LAPAROSCOPIC TUBAL LIGATION Bilateral 04/29/2016   Procedure: LAPAROSCOPIC TUBAL LIGATION;  Surgeon: Vena Austria, MD;  Location: [facility] ORS;  Service: Gynecology;  Laterality: Bilateral;  . NO PAST SURGERIES    . TOTAL LAPAROSCOPIC HYSTERECTOMY WITH  SALPINGECTOMY N/A 04/16/2019   Procedure: TOTAL LAPAROSCOPIC HYSTERECTOMY WITH BILATERAL SALPINGECTOMY;  Surgeon: Nadara Mustard, MD;  Location: [facility] ORS;  Service: Gynecology;  Laterality: N/A;    Prior to Admission medications   Medication Sig Start Date End Date Taking? Authorizing Provider  acyclovir (ZOVIRAX) 800 MG tablet Take 1 tablet (800 mg total) by mouth 5 (five) times daily for 7 days. 10/21/19 10/28/19  Enid Derry, PA-C  predniSONE (DELTASONE) 10 MG tablet Take 6 tablets on day 1, take 5 tablets on day 2, take 4 tablets on day 3, take 3 tablets on day 4, take 2 tablets on day 5, take 1 tablet on day 6 10/21/19   Enid Derry, PA-C    Allergies Codeine  Family History  Problem Relation Age of Onset  . Stroke Mother   . Diabetes Maternal Grandmother   . Heart disease Maternal Grandmother     Social History Social History   Tobacco Use  . Smoking status: Former Smoker    Packs/day: 0.25    Years: 4.00    Pack years: 1.00    Types: Cigarettes    Quit date: 04/25/2010    Years since quitting: 9.4  . Smokeless tobacco: Never Used  Vaping Use  . Vaping Use: Never used  Substance Use Topics  . Alcohol use: Yes    Comment: occasional   . Drug use: No     Review of Systems  Constitutional: No fever/chills Cardiovascular: No chest pain. Respiratory: No SOB. Gastrointestinal: No nausea, no vomiting.  Musculoskeletal: Negative for musculoskeletal pain. Skin:  Negative for abrasions, lacerations, ecchymosis. Positive for rash. Neurological: Negative for headaches   ____________________________________________   PHYSICAL EXAM:  VITAL SIGNS: ED Triage Vitals  Enc Vitals Group     BP 10/21/19 1205 (!) 125/99     Pulse Rate 10/21/19 1205 88     Resp 10/21/19 1205 16     Temp 10/21/19 1205 99.2 F (37.3 C)     Temp Source 10/21/19 1205 Oral     SpO2 10/21/19 1205 100 %     Weight 10/21/19 1200 145 lb (65.8 kg)     Height 10/21/19 1200 5\' 2"  (1.575 m)      Head Circumference --      Peak Flow --      Pain Score 10/21/19 1200 10     Pain Loc --      Pain Edu? --      Excl. in GC? --      Constitutional: Alert and oriented. Well appearing and in no acute distress. Eyes: Conjunctivae are normal. PERRL. EOMI. Head: Atraumatic. ENT:      Ears:      Nose: No congestion/rhinnorhea.      Mouth/Throat: Mucous membranes are moist.  Neck: No stridor. Cardiovascular: Normal rate, regular rhythm.  Good peripheral circulation. Respiratory: Normal respiratory effort without tachypnea or retractions. Lungs CTAB. Good air entry to the bases with no decreased or absent breath sounds. Musculoskeletal: Full range of motion to all extremities. No gross deformities appreciated. Neurologic:  Normal speech and language. No gross focal neurologic deficits are appreciated.  Skin:  Skin is warm, dry and intact.  Mild erythema to right posterior neck over CT 5 dermatome. Psychiatric: Mood and affect are normal. Speech and behavior are normal. Patient exhibits appropriate insight and judgement.   ____________________________________________   LABS (all labs ordered are listed, but only abnormal results are displayed)  Labs Reviewed - No data to display ____________________________________________  EKG   ____________________________________________  RADIOLOGY   No results found.  ____________________________________________    PROCEDURES  Procedure(s) performed:    Procedures    Medications - No data to display   ____________________________________________   INITIAL IMPRESSION / ASSESSMENT AND PLAN / ED COURSE  Pertinent labs & imaging results that were available during my care of the patient were reviewed by me and considered in my medical decision making (see chart for details).  Review of the [place] CSRS was performed in accordance of the [facility] prior to dispensing any controlled drugs.   Patient presented to emergency department  for evaluation of rash.  Vital signs and exam are reassuring.  Rash not entirely consistent with shingles but may be shingles.  She may also have contact with an allergen.  Patient will be discharged home with prescriptions for acyclovir and prednisone. Patient is to follow up with primary care dermatology as directed.  Referral was given.  Patient is given ED precautions to return to the ED for any worsening or new symptoms.  Stefanie Mason was evaluated in Emergency Department on 10/21/2019 for the symptoms described in the history of present illness. She was evaluated in the context of the global COVID-19 pandemic, which necessitated consideration that the patient might be at risk for infection with the SARS-CoV-2 virus that causes COVID-19. Institutional protocols and algorithms that pertain to the evaluation of patients at risk for COVID-19 are in a state of rapid change based on information released by regulatory bodies including the CDC and federal and state organizations. These policies and algorithms  were followed during the patient's care in the ED.   ____________________________________________  FINAL CLINICAL IMPRESSION(S) / ED DIAGNOSES  Final diagnoses:  Rash      NEW MEDICATIONS STARTED DURING THIS VISIT:  ED Discharge Orders         Ordered    predniSONE (DELTASONE) 10 MG tablet     Discontinue  Reprint     10/21/19 1415    acyclovir (ZOVIRAX) 800 MG tablet  5 times daily     Discontinue  Reprint     10/21/19 1415              This chart was dictated using voice recognition software/Dragon. Despite best efforts to proofread, errors can occur which can change the meaning. Any change was purely unintentional.    Enid Derry, PA-C 10/21/19 1531    Arnaldo Natal, MD 10/21/19 [phone]

## 2020-01-07 ENCOUNTER — Emergency Department
Admission: EM | Admit: 2020-01-07 | Discharge: 2020-01-07 | Disposition: A | Discharge status: Home / Self Care | Payer: BC Managed Care – PPO | Attending: Student in an Organized Health Care Education/Training Program | Admitting: Student in an Organized Health Care Education/Training Program

## 2020-01-07 ENCOUNTER — Other Ambulatory Visit: Payer: Self-pay

## 2020-01-07 ENCOUNTER — Encounter: Payer: Self-pay | Admitting: Emergency Medicine

## 2020-01-07 DIAGNOSIS — M546 Pain in thoracic spine: Secondary | ICD-10-CM | POA: Diagnosis not present

## 2020-01-07 DIAGNOSIS — M545 Low back pain: Secondary | ICD-10-CM | POA: Insufficient documentation

## 2020-01-07 DIAGNOSIS — M549 Dorsalgia, unspecified: Secondary | ICD-10-CM

## 2020-01-07 DIAGNOSIS — Z87891 Personal history of nicotine dependence: Secondary | ICD-10-CM | POA: Insufficient documentation

## 2020-01-07 LAB — URINALYSIS, COMPLETE (UACMP) WITH MICROSCOPIC
Bilirubin Urine: NEGATIVE
Glucose, UA: NEGATIVE mg/dL
Hgb urine dipstick: NEGATIVE
Ketones, ur: NEGATIVE mg/dL
Leukocytes,Ua: NEGATIVE
Nitrite: NEGATIVE
Protein, ur: NEGATIVE mg/dL
Specific Gravity, Urine: 1.003 — ABNORMAL LOW (ref 1.005–1.030)
pH: 7 (ref 5.0–8.0)

## 2020-01-07 MED ORDER — METHOCARBAMOL 500 MG PO TABS: 500.0000 mg | ORAL_TABLET | Freq: Four times a day (QID) | ORAL | 0 refills | Status: DC

## 2020-01-07 MED ORDER — LIDOCAINE 5 % EX PTCH
1.0000 | MEDICATED_PATCH | CUTANEOUS | Status: DC
  Administered 2020-01-07: 1 via TRANSDERMAL
  Filled 2020-01-07: qty 1

## 2020-01-07 MED ORDER — ORPHENADRINE CITRATE 30 MG/ML IJ SOLN
60.0000 mg | Freq: Two times a day (BID) | INTRAMUSCULAR | Status: DC
  Administered 2020-01-07: 60 mg via INTRAMUSCULAR
  Filled 2020-01-07: qty 2

## 2020-01-07 MED ORDER — TRAMADOL HCL 50 MG PO TABS: 50.0000 mg | ORAL_TABLET | Freq: Four times a day (QID) | ORAL | 0 refills | Status: DC | PRN

## 2020-01-07 MED ORDER — KETOROLAC TROMETHAMINE 30 MG/ML IJ SOLN
30.0000 mg | Freq: Once | INTRAMUSCULAR | Status: AC
  Administered 2020-01-07: 30 mg via INTRAMUSCULAR
  Filled 2020-01-07: qty 1

## 2020-01-07 NOTE — ED Notes (Signed)
See triage note  Presents with lower back pain  States hx of same  States pain does not radiate into legs  Ambulates  well

## 2020-01-07 NOTE — ED Provider Notes (Signed)
[facility] Emergency Department Provider Note  ____________________________________________  Time seen: Approximately 11:59 AM  I have reviewed the triage vital signs and the nursing notes.   HISTORY  Chief Complaint Back Pain    HPI Stefanie Mason is a 34 y.o. female that presents to the emergency department for evaluation of left mid to low back pain acute on chronic for 1 day.  Pain is worse when she goes to stand up or when she walks.  She has had back pain for several years.  She was seeing a [facility] but discontinued seeing them in August due to her insurance changing.  Patient states that she was seen in the emergency department previously for her back pain and was given a shot with relief.  No specific trauma.  Patient did change jobs in August.  No bowel or bladder dysfunction or saddle anesthesias.  No fever, shortness of breath, chest pain, vomiting, urinary symptoms.   Past Medical History:  Diagnosis Date   Anemia    H/O   Back pain    Heart murmur    ASYMPTOMATIC   Pregnancy induced hypertension     Patient Active Problem List   Diagnosis Date Noted   S/P laparoscopic hysterectomy 04/29/2019   CIN III (cervical intraepithelial neoplasia grade III) with severe dysplasia 01/14/2019   HGSIL (high grade squamous intraepithelial lesion) on Pap smear of cervix 11/14/2018   Irregular menses 11/14/2018   Postpartum care following vaginal delivery 01/14/2016   Gestational hypertension 01/11/2016   Labor and delivery, indication for care 10/24/2015    Past Surgical History:  Procedure Laterality Date   CERVICAL BIOPSY  W/ LOOP ELECTRODE EXCISION     CERVICAL BIOPSY  W/ LOOP ELECTRODE EXCISION  2020   CYSTOSCOPY  04/16/2019   Procedure: CYSTOSCOPY;  Surgeon: Nadara Mustard, MD;  Location: [facility] ORS;  Service: Gynecology;;   LAPAROSCOPIC TUBAL LIGATION Bilateral 04/29/2016   Procedure: LAPAROSCOPIC TUBAL LIGATION;  Surgeon:  Vena Austria, MD;  Location: [facility] ORS;  Service: Gynecology;  Laterality: Bilateral;   NO PAST SURGERIES     TOTAL LAPAROSCOPIC HYSTERECTOMY WITH SALPINGECTOMY N/A 04/16/2019   Procedure: TOTAL LAPAROSCOPIC HYSTERECTOMY WITH BILATERAL SALPINGECTOMY;  Surgeon: Nadara Mustard, MD;  Location: [facility] ORS;  Service: Gynecology;  Laterality: N/A;    Prior to Admission medications   Medication Sig Start Date End Date Taking? Authorizing Provider  methocarbamol (ROBAXIN) 500 MG tablet Take 1 tablet (500 mg total) by mouth 4 (four) times daily. 01/07/20   Enid Derry, PA-C  methocarbamol (ROBAXIN) 500 MG tablet Take 1 tablet (500 mg total) by mouth 4 (four) times daily. 01/07/20   Enid Derry, PA-C  traMADol (ULTRAM) 50 MG tablet Take 1 tablet (50 mg total) by mouth every 6 (six) hours as needed. 01/07/20 01/06/21  Enid Derry, PA-C    Allergies Codeine  Family History  Problem Relation Age of Onset   Stroke Mother    Diabetes Maternal Grandmother    Heart disease Maternal Grandmother     Social History Social History   Tobacco Use   Smoking status: Former Smoker    Packs/day: 0.25    Years: 4.00    Pack years: 1.00    Types: Cigarettes    Quit date: 04/25/2010    Years since quitting: 9.7   Smokeless tobacco: Never Used  Vaping Use   Vaping Use: Never used  Substance Use Topics   Alcohol use: Yes   Drug use: No  Review of Systems  Constitutional: No fever/chills ENT: No upper respiratory complaints. Cardiovascular: No chest pain. Respiratory: No cough. No SOB. Gastrointestinal: No abdominal pain.  No nausea, no vomiting.  Musculoskeletal: Positive for back pain. Skin: Negative for rash, abrasions, lacerations, ecchymosis. Neurological: Negative for headaches   ____________________________________________   PHYSICAL EXAM:  VITAL SIGNS: ED Triage Vitals  Enc Vitals Group     BP 01/07/20 1140 (!) 125/96     Pulse Rate 01/07/20 1140 93     Resp  01/07/20 1140 18     Temp 01/07/20 1140 98.9 F (37.2 C)     Temp Source 01/07/20 1140 Oral     SpO2 01/07/20 1140 100 %     Weight 01/07/20 1141 145 lb (65.8 kg)     Height 01/07/20 1141 5' (1.524 m)     Head Circumference --      Peak Flow --      Pain Score 01/07/20 1140 10     Pain Loc --      Pain Edu? --      Excl. in GC? --      Constitutional: Alert and oriented. Well appearing and in no acute distress. Eyes: Conjunctivae are normal. PERRL. EOMI. Head: Atraumatic. ENT:      Ears:      Nose: No congestion/rhinnorhea.      Mouth/Throat: Mucous membranes are moist.  Neck: No stridor.   Cardiovascular: Normal rate, regular rhythm.  Good peripheral circulation. Respiratory: Normal respiratory effort without tachypnea or retractions. Lungs CTAB. Good air entry to the bases with no decreased or absent breath sounds. Gastrointestinal: Bowel sounds 4 quadrants. Soft and nontender to palpation. No guarding or rigidity. No palpable masses. No distention.  Musculoskeletal: Full range of motion to all extremities. No gross deformities appreciated.  Tenderness to palpation to left mid to low back.  Pain elicited with standing and with rotation of spine.  No tenderness to palpation over thoracic or lumbar spine.  Antalgic gait. Neurologic:  Normal speech and language. No gross focal neurologic deficits are appreciated.  Skin:  Skin is warm, dry and intact. No rash noted. Psychiatric: Mood and affect are normal. Speech and behavior are normal. Patient exhibits appropriate insight and judgement.   ____________________________________________   LABS (all labs ordered are listed, but only abnormal results are displayed)  Labs Reviewed  URINALYSIS, COMPLETE (UACMP) WITH MICROSCOPIC - Abnormal; Notable for the following components:      Result Value   Color, Urine STRAW (*)    APPearance CLEAR (*)    Specific Gravity, Urine 1.003 (*)    Bacteria, UA RARE (*)    All other components  within normal limits  URINE CULTURE   ____________________________________________  EKG   ____________________________________________  RADIOLOGY   No results found.  ____________________________________________    PROCEDURES  Procedure(s) performed:    Procedures    Medications  orphenadrine (NORFLEX) injection 60 mg (60 mg Intramuscular Given 01/07/20 1215)  lidocaine (LIDODERM) 5 % 1 patch (1 patch Transdermal Patch Applied 01/07/20 1220)  ketorolac (TORADOL) 30 MG/ML injection 30 mg (30 mg Intramuscular Given 01/07/20 1310)     ____________________________________________   INITIAL IMPRESSION / ASSESSMENT AND PLAN / ED COURSE  Pertinent labs & imaging results that were available during my care of the patient were reviewed by me and considered in my medical decision making (see chart for details).  Review of the [place] CSRS was performed in accordance of the [facility] prior to dispensing any controlled  drugs.   Patient presented to the emergency department for evaluation of acute on chronic back pain.  Vital signs and exam are reassuring.  Urinalysis noncontributory to infection.  Patient was given Toradol and Norflex with improvement of her symptoms.  Patient will be discharged home with prescriptions for Robaxin and a short course of tramadol. Patient is to follow up with primary care as directed. Patient is given ED precautions to return to the ED for any worsening or new symptoms.  Stefanie Mason was evaluated in Emergency Department on 01/07/2020 for the symptoms described in the history of present illness. She was evaluated in the context of the global COVID-19 pandemic, which necessitated consideration that the patient might be at risk for infection with the SARS-CoV-2 virus that causes COVID-19. Institutional protocols and algorithms that pertain to the evaluation of patients at risk for COVID-19 are in a state of rapid change based on information released by regulatory  bodies including the CDC and federal and state organizations. These policies and algorithms were followed during the patient's care in the ED.   ____________________________________________  FINAL CLINICAL IMPRESSION(S) / ED DIAGNOSES  Final diagnoses:  Mid back pain      NEW MEDICATIONS STARTED DURING THIS VISIT:  ED Discharge Orders         Ordered    traMADol (ULTRAM) 50 MG tablet  Every 6 hours PRN        01/07/20 1342    methocarbamol (ROBAXIN) 500 MG tablet  4 times daily        01/07/20 1342    methocarbamol (ROBAXIN) 500 MG tablet  4 times daily        01/07/20 1343              This chart was dictated using voice recognition software/Dragon. Despite best efforts to proofread, errors can occur which can change the meaning. Any change was purely unintentional.    Enid Derry, PA-C 01/07/20 1404    Willy Eddy, MD 01/07/20 1444

## 2020-01-07 NOTE — ED Notes (Signed)
Pt discharged home after verbalizing understanding of discharge instructions; nad noted. 

## 2020-01-07 NOTE — ED Triage Notes (Signed)
In via POV, reports back pain x one day, states she bent over last night and then pain has increased today.  Using [facility] without relief.  Ambulatory to triage, vitals WDL.

## 2020-01-08 LAB — URINE CULTURE: Culture: NO GROWTH

## 2020-03-11 ENCOUNTER — Telehealth: Payer: Self-pay | Admitting: Pharmacist

## 2020-03-11 NOTE — Telephone Encounter (Signed)
Patient failed to provide requested 2021 financial documentation. No additional medication assistance will be provided by [facility] without the required proof of income documentation. Patient notified by letter Debra Cheek Administrative Assistant [facility] 

## 2020-05-04 ENCOUNTER — Ambulatory Visit: Payer: BC Managed Care – PPO | Admitting: Obstetrics & Gynecology

## 2020-05-21 ENCOUNTER — Ambulatory Visit: Payer: Medicaid Other | Admitting: Obstetrics & Gynecology

## 2020-06-22 ENCOUNTER — Other Ambulatory Visit: Payer: Self-pay | Admitting: Family Medicine

## 2020-06-23 LAB — CBC WITH DIFFERENTIAL/PLATELET
Basophils Absolute: 0.1 10*3/uL
Basos: 1 %
EOS (ABSOLUTE): 0.2 10*3/uL
Eos: 2 %
Hematocrit: 37.4 %
Hemoglobin: 12.6 g/dL
Immature Grans (Abs): 0 10*3/uL
Immature Granulocytes: 0 %
Lymphocytes Absolute: 2 10*3/uL
Lymphs: 23 %
MCH: 28.3 pg
MCHC: 33.7 g/dL
MCV: 84 fL
Monocytes Absolute: 0.5 10*3/uL
Monocytes: 5 %
Neutrophils Absolute: 6 10*3/uL
Neutrophils: 69 %
Platelets: 200 10*3/uL (ref 150–450)
RBC: 4.45 x10E6/uL
RDW: 12.8 %
WBC: 8.7 10*3/uL (ref 3.4–10.8)

## 2020-06-26 ENCOUNTER — Other Ambulatory Visit: Payer: Self-pay

## 2020-06-26 ENCOUNTER — Emergency Department: Payer: Self-pay

## 2020-06-26 ENCOUNTER — Encounter: Payer: Self-pay | Admitting: Emergency Medicine

## 2020-06-26 ENCOUNTER — Emergency Department
Admission: EM | Admit: 2020-06-26 | Discharge: 2020-06-26 | Disposition: A | Discharge status: Home / Self Care | Payer: Self-pay | Attending: Emergency Medicine | Admitting: Emergency Medicine

## 2020-06-26 DIAGNOSIS — Z20822 Contact with and (suspected) exposure to covid-19: Secondary | ICD-10-CM | POA: Insufficient documentation

## 2020-06-26 DIAGNOSIS — Z87891 Personal history of nicotine dependence: Secondary | ICD-10-CM | POA: Insufficient documentation

## 2020-06-26 DIAGNOSIS — J0100 Acute maxillary sinusitis, unspecified: Secondary | ICD-10-CM | POA: Insufficient documentation

## 2020-06-26 LAB — SARS CORONAVIRUS 2 (TAT 6-24 HRS): SARS Coronavirus 2: NEGATIVE

## 2020-06-26 MED ORDER — BENZONATATE 100 MG PO CAPS: 200.0000 mg | ORAL_CAPSULE | Freq: Three times a day (TID) | ORAL | 0 refills | Status: AC | PRN

## 2020-06-26 MED ORDER — AMOXICILLIN 875 MG PO TABS: 875.0000 mg | ORAL_TABLET | Freq: Two times a day (BID) | ORAL | 0 refills | Status: DC

## 2020-06-26 MED ORDER — FEXOFENADINE-PSEUDOEPHED ER 60-120 MG PO TB12: 1.0000 | ORAL_TABLET | Freq: Two times a day (BID) | ORAL | 0 refills | Status: DC

## 2020-06-26 NOTE — Discharge Instructions (Signed)
Follow discharge care instruction take medication as directed.  Advised self quarantine pending results of COVID-19 test.  If test is positive quarantine additional 10 days.  Results of COVID-19 test can be found later today in the MyChart app.

## 2020-06-26 NOTE — ED Triage Notes (Signed)
Patient to ER for c/o cough with sinus congestion x2 weeks.

## 2020-06-26 NOTE — ED Notes (Signed)
See triage note  Presents with cough  States she developed some sinus pressure about 2 weeks ago  Took OTC meds  Sx's eased off  Then returned over the past few days   No fever or body aches  Afebrile on arrival

## 2020-06-26 NOTE — ED Provider Notes (Signed)
[facility] Emergency Department Provider Note   ____________________________________________   Event Date/Time   First MD Initiated Contact with Patient 06/26/20 [id number]     (approximate)  I have reviewed the triage vital signs and the nursing notes.   HISTORY  Chief Complaint Cough    HPI [name] is a 35 y.o. female patient presents with 2 weeks of sinus congestion, cough, chest congestion.  Patient denies recent travel or known contact with COVID-19 patients not taken the COVID-19 vaccine.  Patient denies fever chills associated with complaint.  Patient denies nausea, vomiting, diarrhea.         Past Medical History:  Diagnosis Date  . Anemia    H/O  . Back pain   . Heart murmur    ASYMPTOMATIC  . Pregnancy induced hypertension     Patient Active Problem List   Diagnosis Date Noted  . S/P laparoscopic hysterectomy 04/29/2019  . CIN III (cervical intraepithelial neoplasia grade III) with severe dysplasia 01/14/2019  . HGSIL (high grade squamous intraepithelial lesion) on Pap smear of cervix 11/14/2018  . Irregular menses 11/14/2018  . Postpartum care following vaginal delivery 01/14/2016  . Gestational hypertension 01/11/2016  . Labor and delivery, indication for care 10/24/2015    Past Surgical History:  Procedure Laterality Date  . CERVICAL BIOPSY  W/ LOOP ELECTRODE EXCISION    . CERVICAL BIOPSY  W/ LOOP ELECTRODE EXCISION  2020  . CYSTOSCOPY  04/16/2019   Procedure: CYSTOSCOPY;  Surgeon: Nadara Mustard, MD;  Location: [facility] ORS;  Service: Gynecology;;  . LAPAROSCOPIC TUBAL LIGATION Bilateral 04/29/2016   Procedure: LAPAROSCOPIC TUBAL LIGATION;  Surgeon: Vena Austria, MD;  Location: [facility] ORS;  Service: Gynecology;  Laterality: Bilateral;  . NO PAST SURGERIES    . TOTAL LAPAROSCOPIC HYSTERECTOMY WITH SALPINGECTOMY N/A 04/16/2019   Procedure: TOTAL LAPAROSCOPIC HYSTERECTOMY WITH BILATERAL SALPINGECTOMY;  Surgeon: Nadara Mustard, MD;  Location: [facility] ORS;  Service: Gynecology;  Laterality: N/A;    Prior to Admission medications   Medication Sig Start Date End Date Taking? Authorizing Provider  amoxicillin (AMOXIL) 875 MG tablet Take 1 tablet (875 mg total) by mouth 2 (two) times daily. 06/26/20  Yes Joni Reining, PA-C  benzonatate (TESSALON PERLES) 100 MG capsule Take 2 capsules (200 mg total) by mouth 3 (three) times daily as needed. 06/26/20 06/26/21 Yes Joni Reining, PA-C  fexofenadine-pseudoephedrine (ALLEGRA-D) 60-120 MG 12 hr tablet Take 1 tablet by mouth 2 (two) times daily. 06/26/20  Yes Joni Reining, PA-C    Allergies Codeine  Family History  Problem Relation Age of Onset  . Stroke Mother   . Diabetes Maternal Grandmother   . Heart disease Maternal Grandmother     Social History Social History   Tobacco Use  . Smoking status: Former Smoker    Packs/day: 0.25    Years: 4.00    Pack years: 1.00    Types: Cigarettes    Quit date: 04/25/2010    Years since quitting: 10.1  . Smokeless tobacco: Never Used  Vaping Use  . Vaping Use: Never used  Substance Use Topics  . Alcohol use: Yes  . Drug use: No    Review of Systems Constitutional: No fever/chills Eyes: No visual changes. ENT: No sore throat.  Nasal congestion. Cardiovascular: Denies chest pain. Respiratory: Denies shortness of breath.  Nonproductive cough. Gastrointestinal: No abdominal pain.  No nausea, no vomiting.  No diarrhea.  No constipation. Genitourinary: Negative for dysuria. Musculoskeletal: Negative for back  pain. Skin: Negative for rash. Neurological: Negative for headaches, focal weakness or numbness. Allergic/Immunilogical: Codeine  ____________________________________________   PHYSICAL EXAM:  VITAL SIGNS: ED Triage Vitals  Enc Vitals Group     BP 06/26/20 0718 133/88     Pulse Rate 06/26/20 0718 96     Resp 06/26/20 0718 15     Temp 06/26/20 0718 98.8 F (37.1 C)     Temp Source 06/26/20 0718  Oral     SpO2 06/26/20 0718 100 %     Weight 06/26/20 0717 145 lb 8.1 oz (66 kg)     Height 06/26/20 0717 5' (1.524 m)     Head Circumference --      Peak Flow --      Pain Score 06/26/20 0716 0     Pain Loc --      Pain Edu? --      Excl. in GC? --    Constitutional: Alert and oriented. Well appearing and in no acute distress. Eyes: Conjunctivae are normal. PERRL. EOMI. Head: Atraumatic. Nose: Edematous nasal turbinates thick rhinorrhea. Mouth/Throat: Mucous membranes are moist.  Oropharynx non-erythematous.  Postnasal drainage. Neck: No stridor.  Hematological/Lymphatic/Immunilogical: No cervical lymphadenopathy. Cardiovascular: Normal rate, regular rhythm. Grossly normal heart sounds.  Good peripheral circulation. Respiratory: Normal respiratory effort.  No retractions. Lungs CTAB. Gastrointestinal: Soft and nontender. No distention. No abdominal bruits. No CVA tenderness. Skin:  Skin is warm, dry and intact. No rash noted. Psychiatric: Mood and affect are normal. Speech and behavior are normal.  ____________________________________________   LABS (all labs ordered are listed, but only abnormal results are displayed)  Labs Reviewed  SARS CORONAVIRUS 2 (TAT 6-24 HRS)   ____________________________________________  EKG   ____________________________________________  RADIOLOGY I, Joni Reining, personally viewed and evaluated these images (plain radiographs) as part of my medical decision making, as well as reviewing the written report by the radiologist.  ED MD interpretation: No acute findings on chest x-ray.  Official radiology report(s): DG Chest 2 View  Result Date: 06/26/2020 CLINICAL DATA:  Cough and congestion for 2 weeks EXAM: CHEST - 2 VIEW COMPARISON:  07/23/2018 chest radiograph. FINDINGS: Stable cardiomediastinal silhouette with normal heart size. No pneumothorax. No pleural effusion. Lungs appear clear, with no acute consolidative airspace disease and no  pulmonary edema. IMPRESSION: No active cardiopulmonary disease. Electronically Signed   By: Delbert Phenix M.D.   On: 06/26/2020 08:27    ____________________________________________   PROCEDURES  Procedure(s) performed (including Critical Care):  Procedures   ____________________________________________   INITIAL IMPRESSION / ASSESSMENT AND PLAN / ED COURSE  As part of my medical decision making, I reviewed the following data within the electronic MEDICAL RECORD NUMBER         Patient presents with 2 weeks of nasal congestion, nonproductive cough, chest congestion.  Discussed no acute findings on chest x-ray.  Advised patient COVID-19 test results are pending.  Patient complaining physical exam is consistent with maxillary sinusitis with postnasal drainage.  Patient given discharge care instructions.  Patient vies take medication as directed.  Patient advised self quarantine pending results of COVID-19 results which can be found in the MyChart app.  Patient advises tested positive was quarantine additional 10 days.  Also advised patient to consider taking the COVID-19 vaccine.      ____________________________________________   FINAL CLINICAL IMPRESSION(S) / ED DIAGNOSES  Final diagnoses:  Subacute maxillary sinusitis     ED Discharge Orders         Ordered  amoxicillin (AMOXIL) 875 MG tablet  2 times daily        06/26/20 0839    fexofenadine-pseudoephedrine (ALLEGRA-D) 60-120 MG 12 hr tablet  2 times daily        06/26/20 0839    benzonatate (TESSALON PERLES) 100 MG capsule  3 times daily PRN        06/26/20 6967          *Please note:  Stefanie Mason was evaluated in Emergency Department on 06/26/2020 for the symptoms described in the history of present illness. She was evaluated in the context of the global COVID-19 pandemic, which necessitated consideration that the patient might be at risk for infection with the SARS-CoV-2 virus that causes COVID-19.  Institutional protocols and algorithms that pertain to the evaluation of patients at risk for COVID-19 are in a state of rapid change based on information released by regulatory bodies including the CDC and federal and state organizations. These policies and algorithms were followed during the patient's care in the ED.  Some ED evaluations and interventions may be delayed as a result of limited staffing during and the pandemic.*   Note:  This document was prepared using Dragon voice recognition software and may include unintentional dictation errors.    Joni Reining, PA-C 06/26/20 8938    Jene Every, MD 06/26/20 [phone]

## 2020-06-29 IMAGING — CR FACIAL BONES COMPLETE 3+V
1 series · 4 of 4 positions shown · non-contrast
Comparison: Head CT 08/27/2009

CLINICAL DATA: Patient was hit in the face with a golf disc. Nose
bleed.

EXAM:
FACIAL BONES COMPLETE 3+V

[Series 1: dg facial bones complete · 0.14mm/px · 4 of 4 slices shown]
[im 1/4]
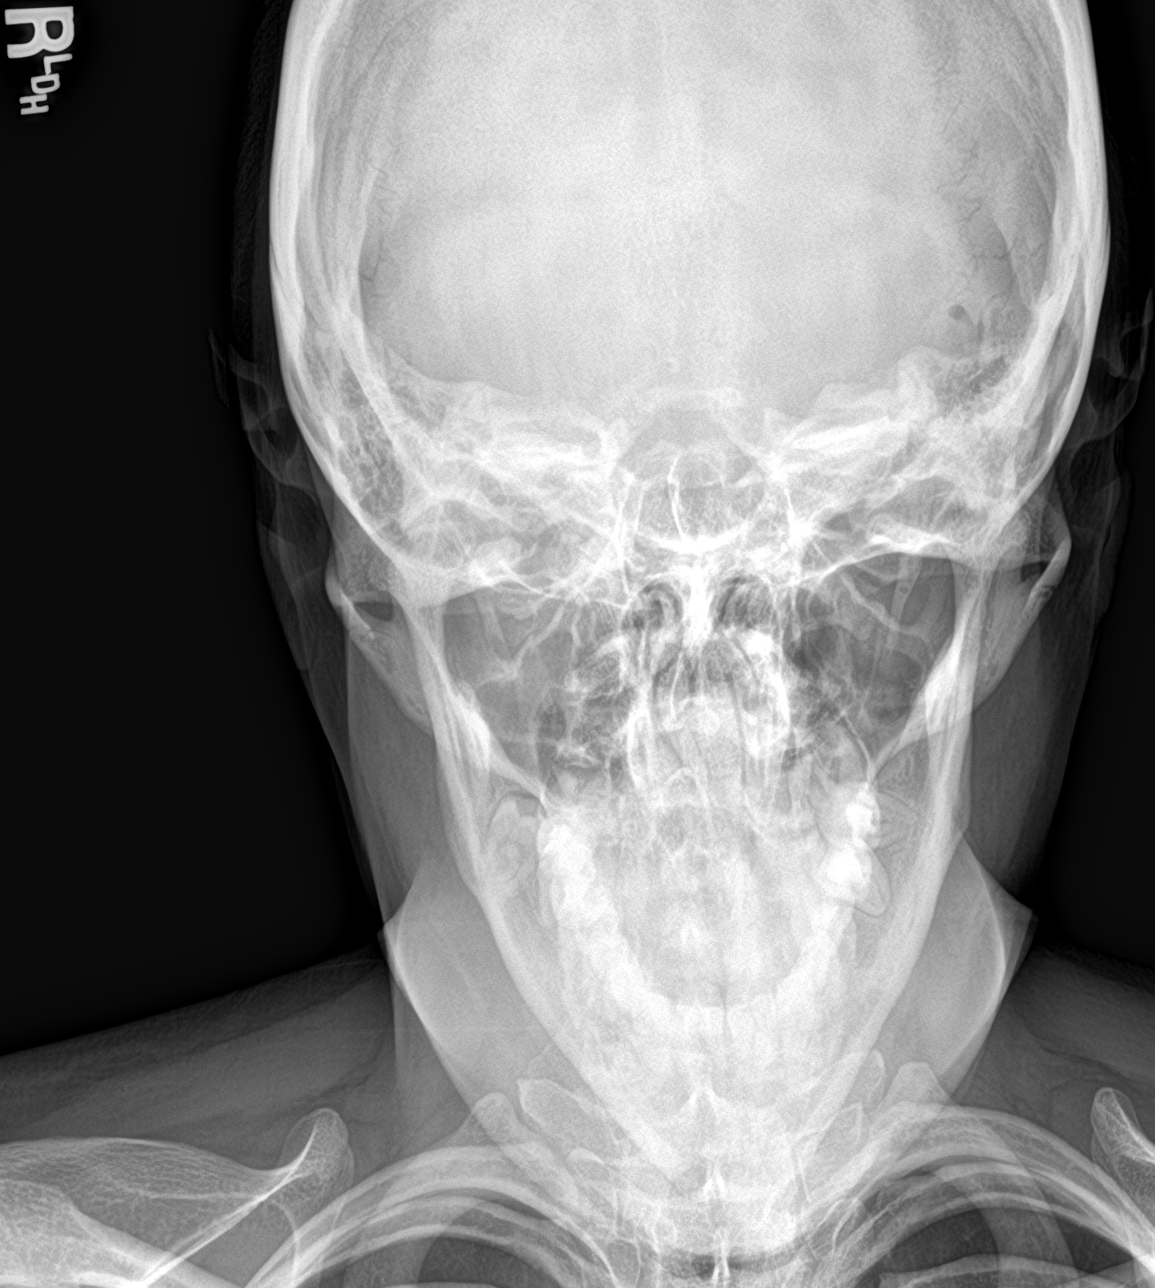
[im 2/4]
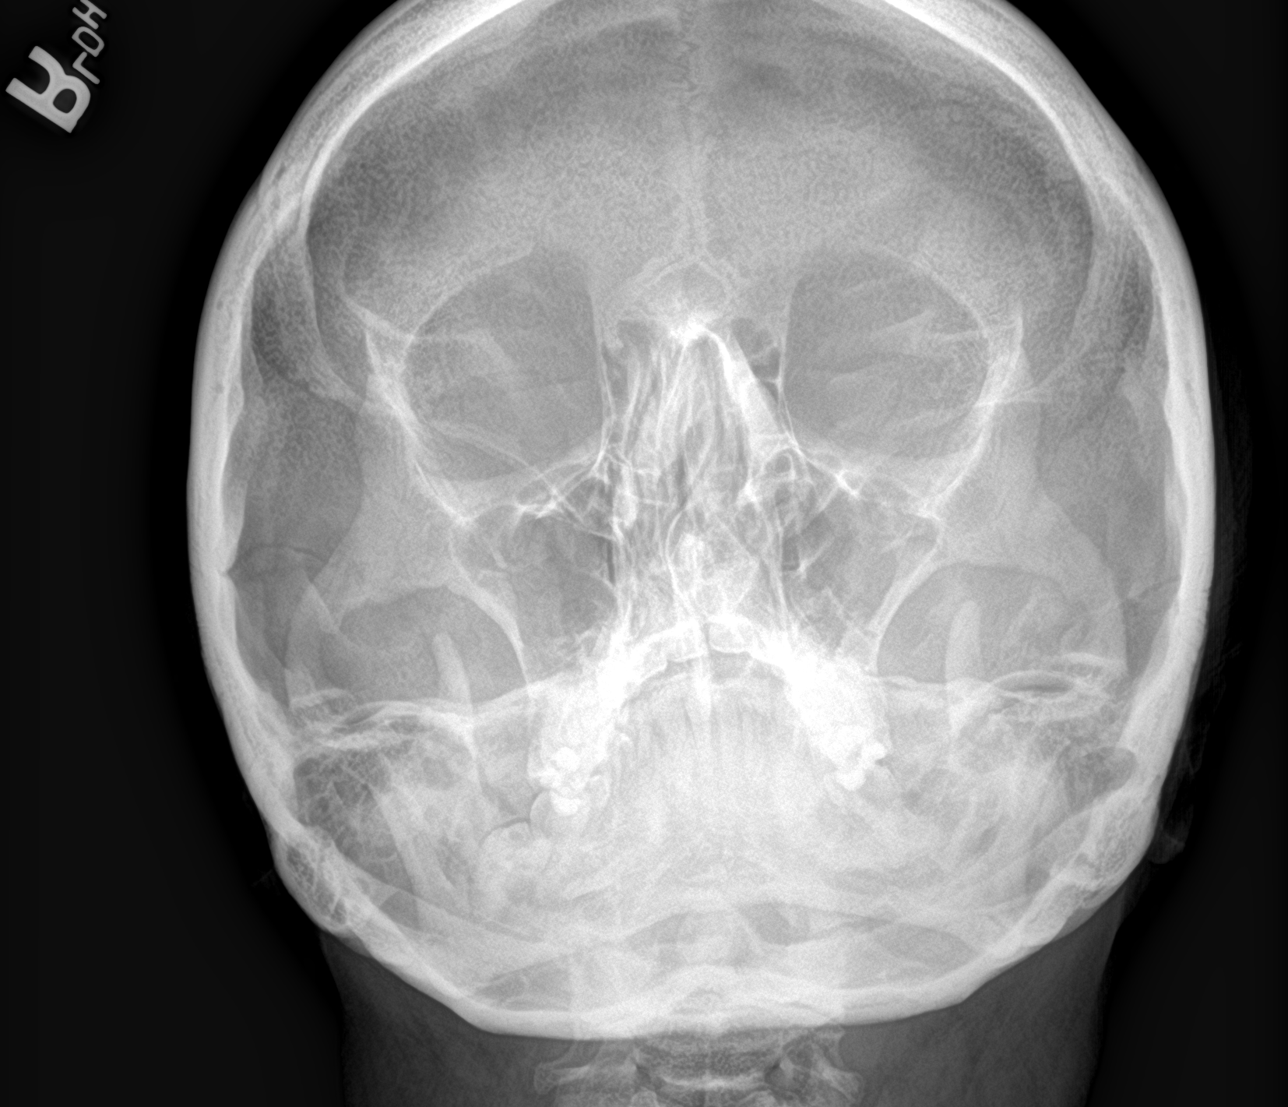
[im 3/4]
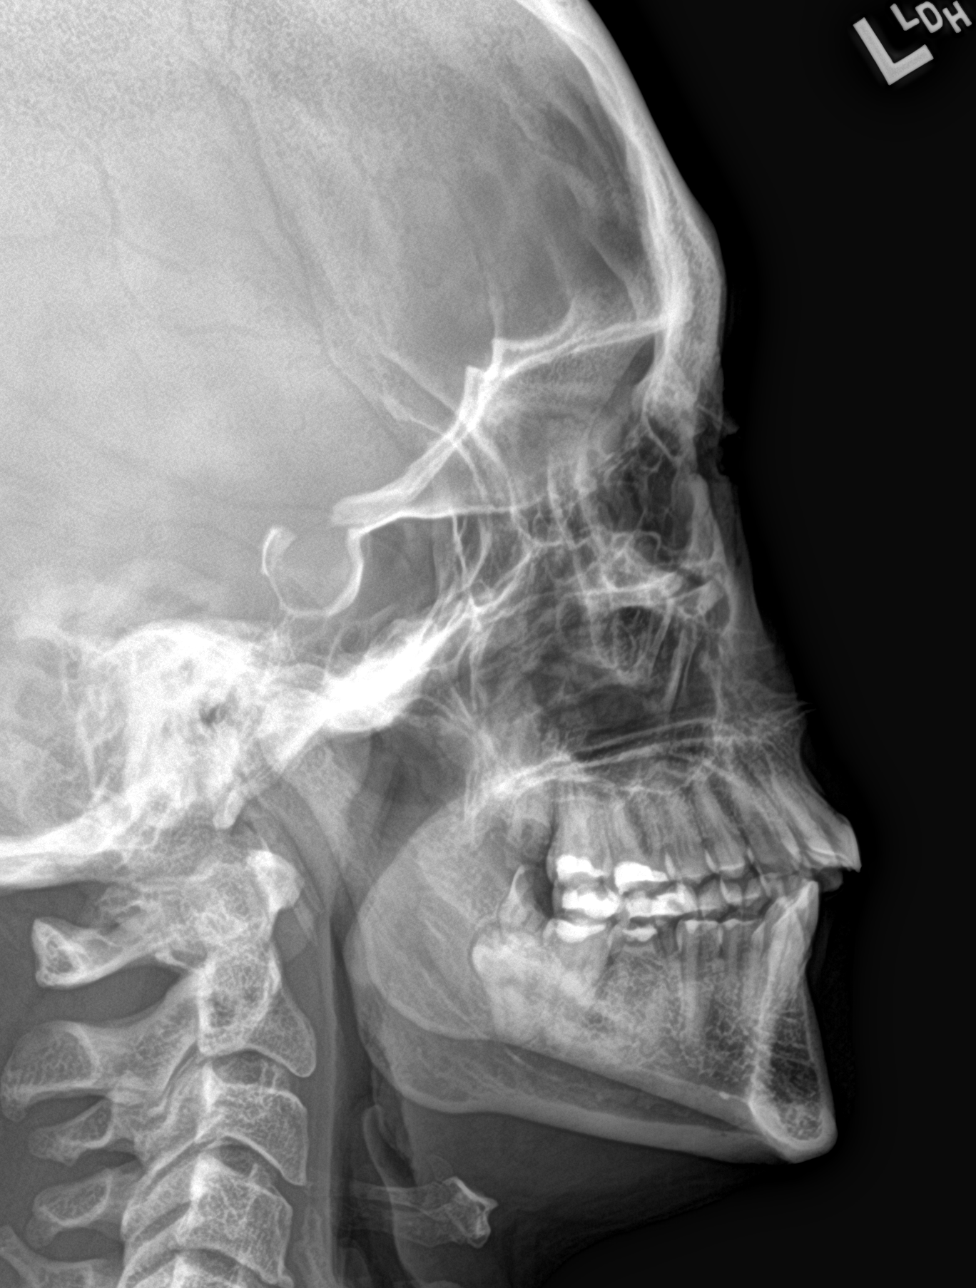
[im 4/4]
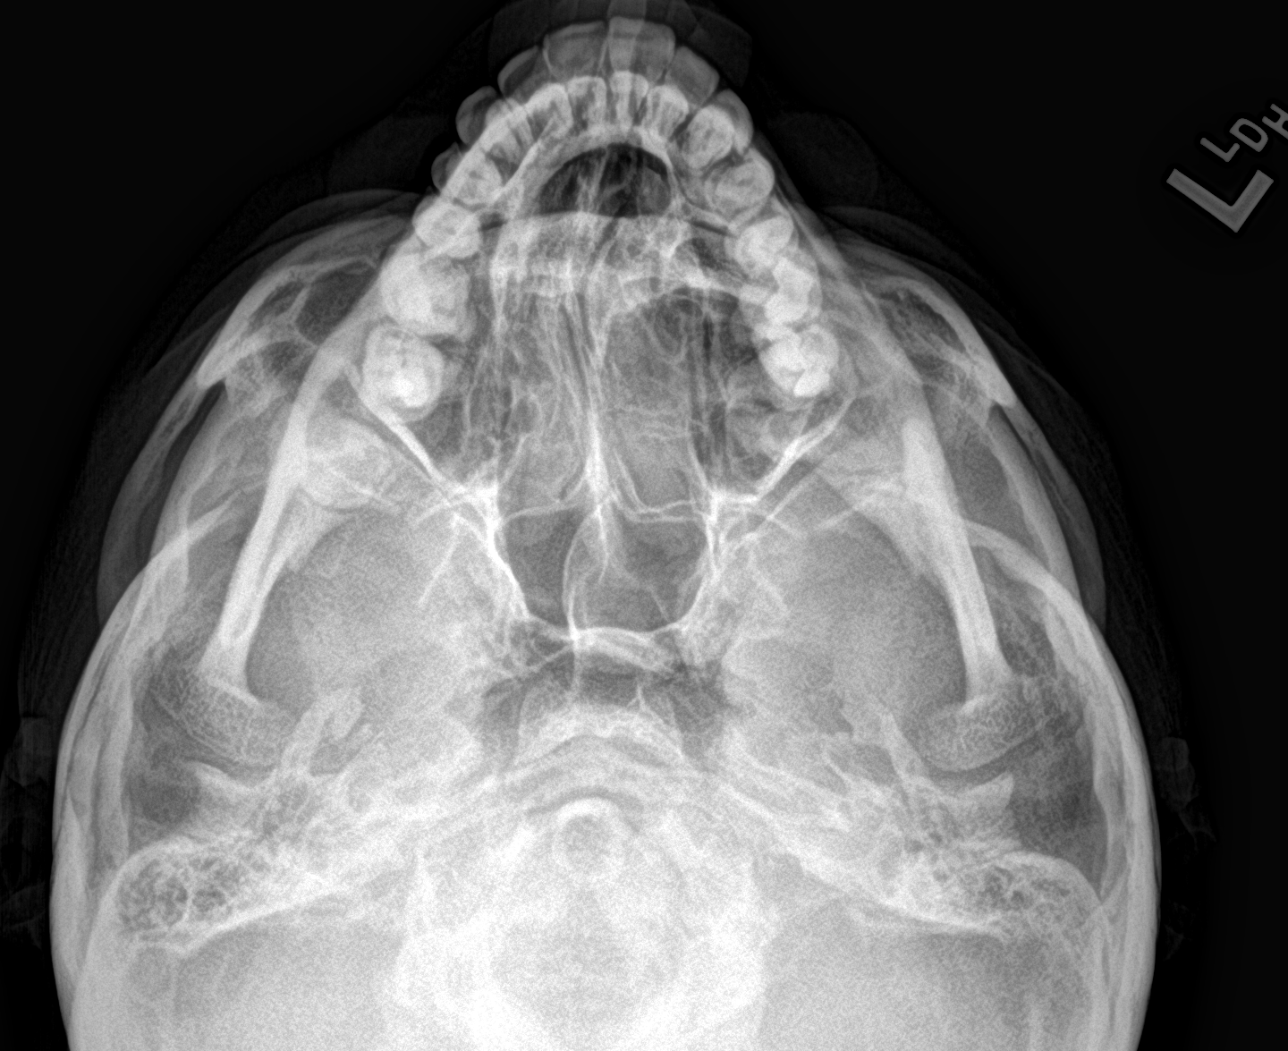

[4 of 4 positions shown; findings below may reference images not displayed]

FINDINGS: There soft tissue swelling over the left nasal bone without definite
fracture identified. The lateral view is limited in the assessment
of the nasal. Mild nasal septal curvature convex to the right is
seen. Zygomatic arches and zygomaticomaxillary complexes appear
intact. The mandible appears intact. Temporomandibular joints appear
intact. Under pneumatized frontal sinus likely developmental.
IMPRESSION: There is mild soft tissue swelling of the left nasal bone. The
lateral view of the face however is suboptimal for assessment of the
nasal bones. Dedicated nasal radiographs may help for better
assessment if clinically warranted.

## 2022-06-14 IMAGING — CR DG CHEST 2V
2 series · 2 of 2 positions shown · non-contrast
Comparison: 07/23/2018 chest radiograph.

CLINICAL DATA: Cough and congestion for 2 weeks

EXAM:
CHEST - 2 VIEW

[chest pa]
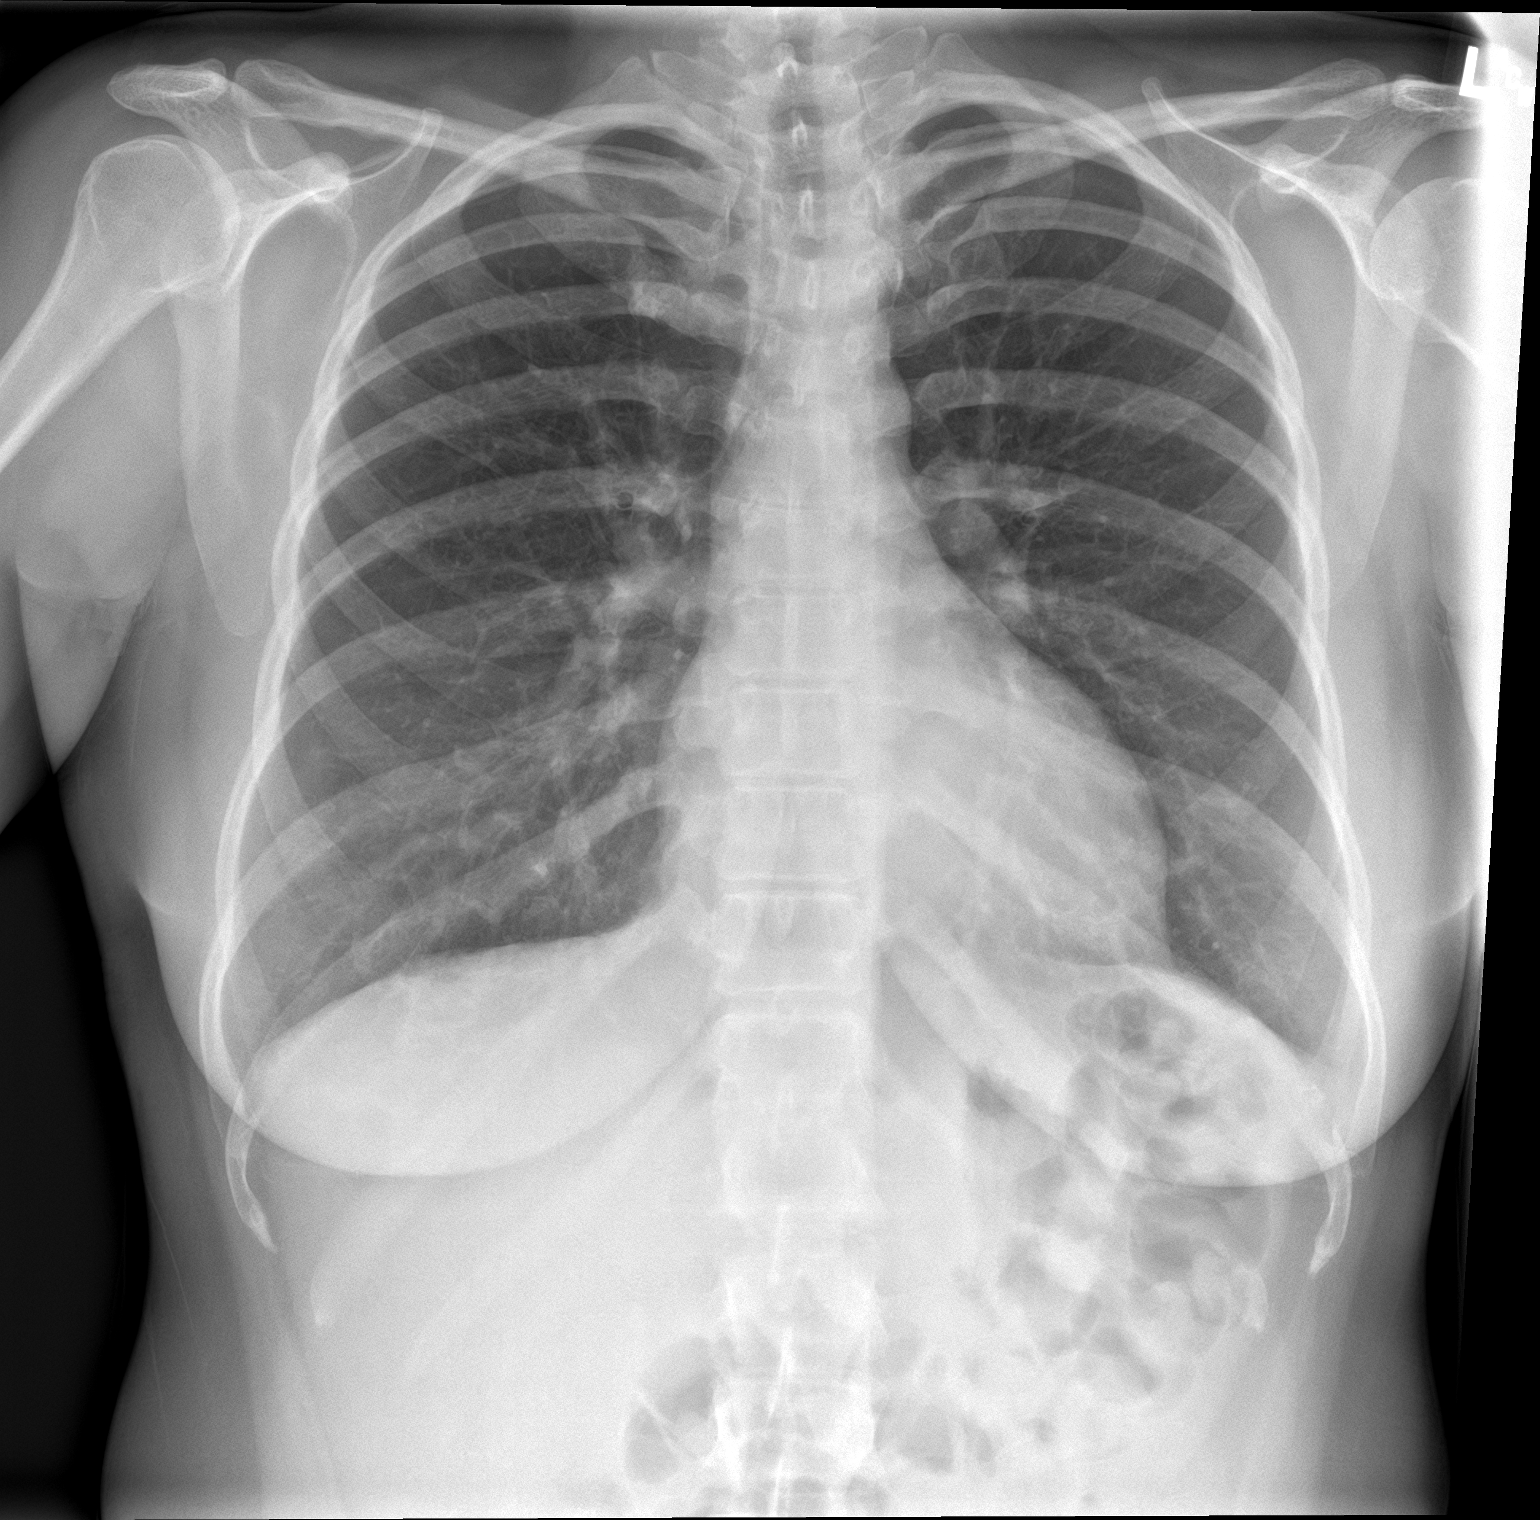

[chest lat]
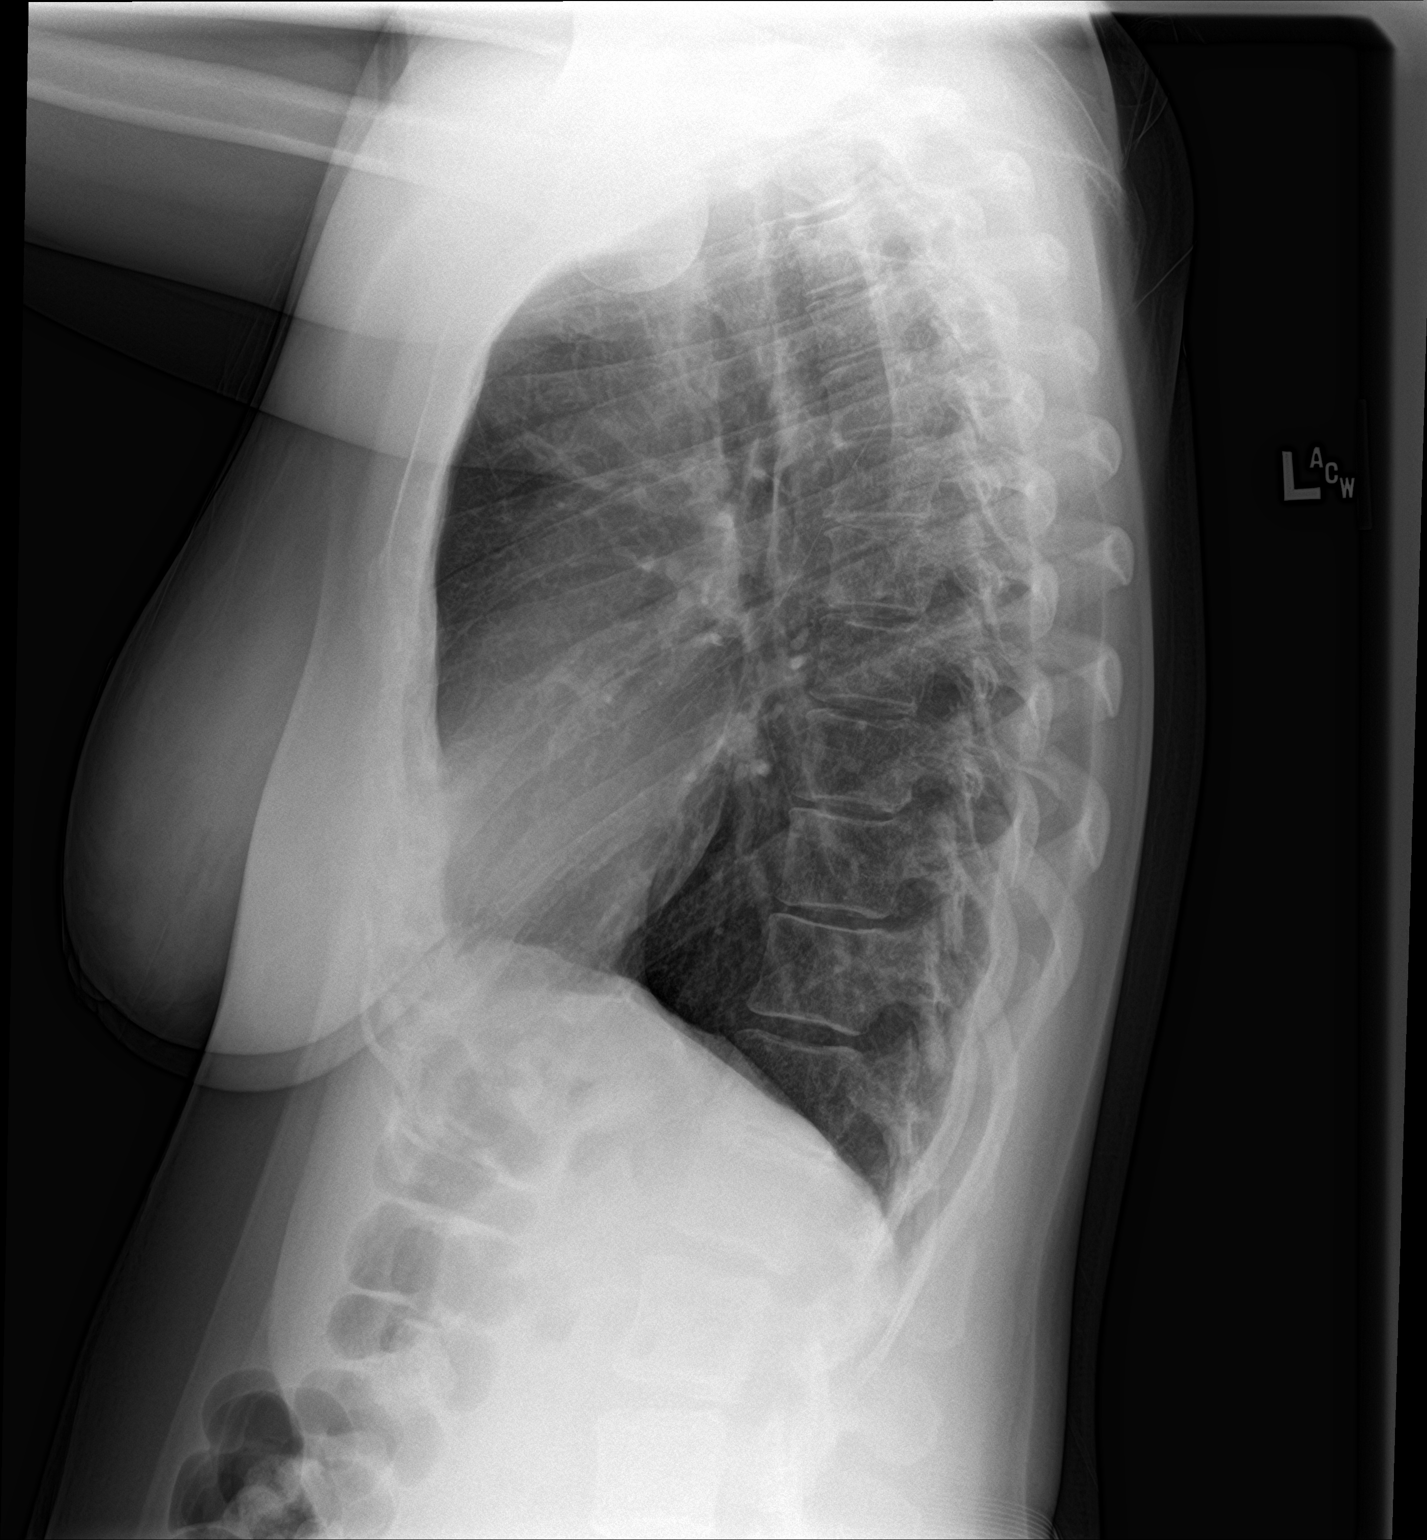

[2 of 2 positions shown; findings below may reference images not displayed]

FINDINGS: Stable cardiomediastinal silhouette with normal heart size. No
pneumothorax. No pleural effusion. Lungs appear clear, with no acute
consolidative airspace disease and no pulmonary edema.
IMPRESSION: No active cardiopulmonary disease.

## 2022-08-10 ENCOUNTER — Emergency Department
Admission: EM | Admit: 2022-08-10 | Discharge: 2022-08-10 | Disposition: A | Discharge status: Home / Self Care | Payer: Managed Care, Other (non HMO) | Attending: Emergency Medicine | Admitting: Emergency Medicine

## 2022-08-10 ENCOUNTER — Emergency Department: Payer: Managed Care, Other (non HMO)

## 2022-08-10 ENCOUNTER — Other Ambulatory Visit: Payer: Self-pay

## 2022-08-10 DIAGNOSIS — W19XXXA Unspecified fall, initial encounter: Secondary | ICD-10-CM

## 2022-08-10 DIAGNOSIS — W010XXA Fall on same level from slipping, tripping and stumbling without subsequent striking against object, initial encounter: Secondary | ICD-10-CM | POA: Insufficient documentation

## 2022-08-10 DIAGNOSIS — M533 Sacrococcygeal disorders, not elsewhere classified: Secondary | ICD-10-CM | POA: Insufficient documentation

## 2022-08-10 MED ORDER — IBUPROFEN 800 MG PO TABS
800.0000 mg | ORAL_TABLET | Freq: Once | ORAL | Status: AC
  Administered 2022-08-10: 800 mg via ORAL
  Filled 2022-08-10: qty 1

## 2022-08-10 NOTE — ED Triage Notes (Signed)
Pt to ED for fall off lawn mower a couple hours ago, states was mowing and hit bump, fell off onto ground, hit tailbone. C/o pain to tailbone. Ambulatory to triage. Appears uncomfortable.

## 2022-08-10 NOTE — ED Provider Notes (Signed)
[facility] Emergency Department Provider Note     Event Date/Time   First MD Initiated Contact with Patient 08/10/22 1535     (approximate)   History   Fall   HPI  Stefanie Mason is a 37 y.o. female with a history of back pain presents to the emergency department after being thrown off of a lawn mower approximately 2-3 hours ago. Patient reports landing on her tailbone and hearing a "pop".  She reports delayed pain that has since become apparent and severe. She reports pain radiates to her left knee.  She denies bladder and bowel incontinence, LOC, numbness, chest pain and shortness of breath.     Physical Exam   Triage Vital Signs: ED Triage Vitals  Enc Vitals Group     BP 08/10/22 1521 (!) 142/107     Pulse Rate 08/10/22 1521 81     Resp 08/10/22 1519 16     Temp 08/10/22 1519 98.4 F (36.9 C)     Temp src --      SpO2 08/10/22 1521 100 %     Weight 08/10/22 1520 147 lb (66.7 kg)     Height 08/10/22 1520 5' (1.524 m)     Head Circumference --      Peak Flow --      Pain Score 08/10/22 1520 10     Pain Loc --      Pain Edu? --      Excl. in GC? --     Most recent vital signs: Vitals:   08/10/22 1519 08/10/22 1521  BP:  (!) 142/107  Pulse:  81  Resp: 16   Temp: 98.4 F (36.9 C)   SpO2:  100%    General Awake, no distress. Patient is lying on her right side. She appears uncomfortable. HEENT NCAT. PERRL. EOMI CV:  Good peripheral perfusion. RRR.  RESP:  Normal effort. Lungs clear bilaterally. ABD:  No distention.  Soft.  Nontender. MSK:  Inspection of lumbar region appears atraumatic. No bruising, lesions, or swelling. Spinal process is midline. No spinal crepitus noted. Non-tender over thoracic and lumbar spine, and paraspinal muscles.Tender over sacral/coccyx region upon light and deep palpation. Sensation intact in LE bilaterally. Muscle strength 5/5. Full AROM & PROM of LE joints. Negative leg raise test bilaterally.       ED Results / Procedures / Treatments    RADIOLOGY  I personally viewed and evaluated these images as part of my medical decision making, as well as reviewing the written report by the radiologist.  ED Provider Interpretation: A sacrum and coccyx x-ray was performed showing no gross deformity of bone structure or fracture.    DG Sacrum/Coccyx  Result Date: 08/10/2022 CLINICAL DATA:  Pain after fall EXAM: SACRUM AND COCCYX - 3 VIEW COMPARISON:  None Available. FINDINGS: Small osteophytes along the sacroiliac joints. Mild degenerative changes of the lower lumbar spine at the edge of the imaging field particularly at L5-S1. There are some well rounded densities in the pelvis which are indeterminate although possibly vascular. No fracture or dislocation. If there is further concern of injury, additional cross-sectional imaging as clinically appropriate IMPRESSION: Degenerative changes. Electronically Signed   By: Karen Kays M.D.   On: 08/10/2022 16:54     PROCEDURES:  Critical Care performed: No  Procedures   MEDICATIONS ORDERED IN ED: Medications  ibuprofen (ADVIL) tablet 800 mg (800 mg Oral Given 08/10/22 1641)     IMPRESSION / MDM / ASSESSMENT AND  PLAN / ED COURSE  I reviewed the triage vital signs and the nursing notes.                             Differential diagnosis includes, but is not limited to, spinal fracture, cauda equina, sciatica, paraspinal muscular strain.  Patient's presentation is most consistent with acute complicated illness / injury requiring diagnostic workup.  Patient's diagnosis is consistent with acute sacrum pain post fall.    37 year old female presents to the emergency department complaining of pain in her tailbone after experiencing a fall.  She was giving 800 mg ibuprofen for pain.  A sacrum coccyx x-ray was performed showing no deformity or fracture.    On reassessment patient's pain has improved and she reported being able to walk better  since her initial presentation. She was offered a prescription of ibuprofen but stated she had OTC Advil at home that she will take for the pain.  Patient was encouraged to apply ice packs and warm compress to affected area as needed. Patient is to follow up with [facility] clinic to schedule an appointment. Patient is given ED precautions to return to the ED for any worsening or new symptoms including but not limited to fever, bladder incontinence, chest pain, and shortness of breath.     FINAL CLINICAL IMPRESSION(S) / ED DIAGNOSES   Final diagnoses:  Fall, initial encounter  Pain in sacrum     Rx / DC Orders   ED Discharge Orders     None        Note:  This document was prepared using Dragon voice recognition software and may include unintentional dictation errors.    Romeo Apple, Amina Menchaca A, PA-C 08/10/22 1748    Corena Herter, MD 08/10/22 2310

## 2022-08-10 NOTE — Discharge Instructions (Addendum)
Take OTC ibuprofen as needed for pain.  Pain may continue as long as 5 to 7 days, this is normal as discussed.  Back exercises pamphlet provided.  Encouraged to stretch, rest, and use heat or ice on affected area as needed 20 minutes on fluids off.  Do not apply directly to skin.  Follow-up with primary care [facility] clinic listed.  Return to ED if symptoms worsen.

## 2023-06-02 ENCOUNTER — Emergency Department
Admission: EM | Admit: 2023-06-02 | Discharge: 2023-06-02 | Disposition: A | Discharge status: Home / Self Care | Payer: Managed Care, Other (non HMO) | Attending: Emergency Medicine | Admitting: Emergency Medicine

## 2023-06-02 ENCOUNTER — Other Ambulatory Visit: Payer: Self-pay

## 2023-06-02 DIAGNOSIS — K0889 Other specified disorders of teeth and supporting structures: Secondary | ICD-10-CM | POA: Diagnosis present

## 2023-06-02 DIAGNOSIS — K047 Periapical abscess without sinus: Secondary | ICD-10-CM | POA: Diagnosis not present

## 2023-06-02 MED ORDER — PENICILLIN V POTASSIUM 500 MG PO TABS: 500.0000 mg | ORAL_TABLET | Freq: Three times a day (TID) | ORAL | 0 refills | Status: AC

## 2023-06-02 NOTE — ED Triage Notes (Signed)
Pt appears to have a lower right dental abscess. Pt sts that the pain was horrible the other day however it is getting better but the swelling is getting worse.

## 2023-06-02 NOTE — ED Provider Notes (Signed)
[facility] Emergency Department Provider Note ____________________________________________  Time seen: Approximately 10:34 AM  I have reviewed the triage vital signs and the nursing notes.   HISTORY  Chief Complaint Dental Problem   HPI Stefanie Mason is a 38 y.o. female with history of anemia presents to the emergency department for treatment and evaluation of right side facial swelling and dental pain.  She has had a broken tooth for years but has not been able to see the dentist.  Pain started 3 days ago but upon awakening this morning she had facial swelling.  No suspected fever.  Prior to Admission medications   Medication Sig Start Date End Date Taking? Authorizing Provider  penicillin v potassium (VEETID) 500 MG tablet Take 1 tablet (500 mg total) by mouth 3 (three) times daily for 10 days. 06/02/23 06/12/23 Yes Malan Werk B, FNP   Allergies Codeine ____________________________________________   PHYSICAL EXAM:  VITAL SIGNS: ED Triage Vitals [06/02/23 1016]  Encounter Vitals Group     BP (!) 128/98     Systolic BP Percentile      Diastolic BP Percentile      Pulse Rate 80     Resp 17     Temp 98.3 F (36.8 C)     Temp Source Oral     SpO2 100 %     Weight 147 lb (66.7 kg)     Height 5' (1.524 m)     Head Circumference      Peak Flow      Pain Score 0     Pain Loc      Pain Education      Exclude from Growth Chart     Constitutional: Alert and oriented. Well appearing and in no acute distress. Eyes: Conjunctiva are clear without discharge or drainage. Mouth/Throat: Chronic appearing dental fracture tooth #30.  Airway is patent.  Sublingual surface is soft. Hematological/Lymphatic/Immunilogical: No palpable anterior cervical nodes Respiratory: Respirations even and unlabored. Musculoskeletal: Full ROM of the jaw. Neurologic: Awake, alert, oriented.  Skin: Skin overlying the area of swelling is erythematous Psychiatric: Affect  and behavior intact.  ____________________________________________   LABS (all labs ordered are listed, but only abnormal results are displayed)  Labs Reviewed - No data to display ____________________________________________   RADIOLOGY  Not indicated. ____________________________________________   PROCEDURES  Procedure(s) performed:   Procedures  Critical Care performed: No ____________________________________________   INITIAL IMPRESSION / ASSESSMENT AND PLAN / ED COURSE  Stefanie Mason is a 38 y.o. female presents to the emergency department for treatment and evaluation of right side facial swelling.  See HPI for further details.  Vital signs are overall reassuring.  Plan will be to treat her with antibiotics and provide her with a list of dental clinics.  ER return precautions discussed as well.  Pertinent labs & imaging results that were available during my care of the patient were reviewed by me and considered in my medical decision making (see chart for details).  ____________________________________________   FINAL CLINICAL IMPRESSION(S) / ED DIAGNOSES  Final diagnoses:  Dental abscess    New Prescriptions   PENICILLIN V POTASSIUM (VEETID) 500 MG TABLET    Take 1 tablet (500 mg total) by mouth 3 (three) times daily for 10 days.    If controlled substance prescribed during this visit, 12 month history viewed on the [facility] prior to issuing an initial prescription for Schedule II or III opiod.  Note:  This document was prepared using [employer]  software and may include unintentional dictation errors.    Chinita Pester, FNP 06/02/23 1217    Jene Every, MD 06/02/23 [phone]

## 2023-06-02 NOTE — Discharge Instructions (Addendum)
Please call and schedule a dental appointment as soon as possible. You will need to be seen within the next 14 days. Return to the emergency department for symptoms that change or worsen if you're unable to schedule an appointment.

## 2023-08-06 ENCOUNTER — Emergency Department

## 2023-08-06 ENCOUNTER — Other Ambulatory Visit: Payer: Self-pay

## 2023-08-06 ENCOUNTER — Emergency Department: Admission: EM | Admit: 2023-08-06 | Discharge: 2023-08-06 | Disposition: A | Discharge status: Home / Self Care

## 2023-08-06 DIAGNOSIS — I10 Essential (primary) hypertension: Secondary | ICD-10-CM | POA: Diagnosis not present

## 2023-08-06 DIAGNOSIS — K047 Periapical abscess without sinus: Secondary | ICD-10-CM | POA: Insufficient documentation

## 2023-08-06 DIAGNOSIS — R220 Localized swelling, mass and lump, head: Secondary | ICD-10-CM

## 2023-08-06 LAB — BASIC METABOLIC PANEL WITH GFR
Anion gap: 8 (ref 5–15)
BUN: 11 mg/dL (ref 6–20)
CO2: 26 mmol/L (ref 22–32)
Calcium: 9 mg/dL (ref 8.9–10.3)
Chloride: 102 mmol/L (ref 98–111)
Creatinine, Ser: 0.74 mg/dL (ref 0.44–1.00)
GFR, Estimated: >60 mL/min (ref >60)
Glucose, Bld: 97 mg/dL (ref 70–99)
Potassium: 3.9 mmol/L (ref 3.5–5.1)
Sodium: 136 mmol/L (ref 135–145)

## 2023-08-06 LAB — CBC WITH DIFFERENTIAL/PLATELET
Abs Immature Granulocytes: 0.02 10*3/uL (ref 0.00–0.07)
Basophils Absolute: 0.1 10*3/uL (ref 0.0–0.1)
Basophils Relative: 1 %
Eosinophils Absolute: 0.2 10*3/uL (ref 0.0–0.5)
Eosinophils Relative: 2 %
HCT: 38.8 % (ref 36.0–46.0)
Hemoglobin: 13.5 g/dL (ref 12.0–15.0)
Immature Granulocytes: 0 %
Lymphocytes Relative: 21 %
Lymphs Abs: 1.7 10*3/uL (ref 0.7–4.0)
MCH: 29.5 pg (ref 26.0–34.0)
MCHC: 34.8 g/dL (ref 30.0–36.0)
MCV: 84.7 fL (ref 80.0–100.0)
Monocytes Absolute: 0.5 10*3/uL (ref 0.1–1.0)
Monocytes Relative: 6 %
Neutro Abs: 5.6 10*3/uL (ref 1.7–7.7)
Neutrophils Relative %: 70 %
Platelets: 196 10*3/uL (ref 150–400)
RBC: 4.58 MIL/uL (ref 3.87–5.11)
RDW: 12.5 % (ref 11.5–15.5)
WBC: 7.9 10*3/uL (ref 4.0–10.5)
nRBC: 0 % (ref 0.0–0.2)

## 2023-08-06 LAB — HCG, QUANTITATIVE, PREGNANCY: hCG, Beta Chain, Quant, S: <1 m[IU]/mL (ref <5)

## 2023-08-06 MED ORDER — AMOXICILLIN-POT CLAVULANATE 875-125 MG PO TABS
1.0000 | ORAL_TABLET | Freq: Once | ORAL | Status: AC
  Administered 2023-08-06: 1 via ORAL
  Filled 2023-08-06: qty 1

## 2023-08-06 MED ORDER — AMOXICILLIN-POT CLAVULANATE 875-125 MG PO TABS: 1.0000 | ORAL_TABLET | Freq: Two times a day (BID) | ORAL | 0 refills | Status: AC

## 2023-08-06 MED ORDER — IOHEXOL 300 MG/ML  SOLN
75.0000 mL | Freq: Once | INTRAMUSCULAR | Status: AC | PRN
  Administered 2023-08-06: 75 mL via INTRAVENOUS

## 2023-08-06 MED ORDER — IBUPROFEN 200 MG PO TABS: 600.0000 mg | ORAL_TABLET | Freq: Three times a day (TID) | ORAL | 2 refills | Status: AC | PRN

## 2023-08-06 MED ORDER — ACETAMINOPHEN 500 MG PO TABS: 1000.0000 mg | ORAL_TABLET | Freq: Four times a day (QID) | ORAL | 2 refills | Status: AC | PRN

## 2023-08-06 NOTE — Discharge Instructions (Signed)
 Your evaluation in the emergency department was notable for evidence of a dental infection, though fortunately we saw no abscess that requires drainage.  I have started you on antibiotics to treat this--please take the full course as prescribed.  Please do follow-up closely with a dentist for reevaluation, and also follow-up with your primary care doctor.  Return to the emergency department with any new or worsening symptoms including fever, worsening swelling, difficulty breathing or swallowing, or any other symptoms concerning to you.

## 2023-08-06 NOTE — ED Triage Notes (Signed)
 Pt to ED POV for R jaw swelling since yesterday morning, also dental pain to R lower jaw and known broken tooth in that area. Pt also believes may have a sinus infection. R lower face is noticeably swollen.

## 2023-08-06 NOTE — ED Provider Notes (Signed)
 [facility] Provider Note    Event Date/Time   First MD Initiated Contact with Patient 08/06/23 1214     (approximate)   History   Facial Swelling  Pt to ED POV for R jaw swelling since yesterday morning, also dental pain to R lower jaw and known broken tooth in that area. Pt also believes may have a sinus infection. R lower face is noticeably swollen.    HPI Stefanie Mason is a 38 y.o. female PMH hypertension, anemia presents for evaluation of facial swelling - Localized to right jaw, present for about 2 days.  Believes she may have a dental infection in this area.  No preceding trauma. -No fever, difficulty breathing, voice change.  Remains tolerating p.o.     Physical Exam   Triage Vital Signs: ED Triage Vitals  Encounter Vitals Group     BP 08/06/23 1207 (!) 129/100     Systolic BP Percentile --      Diastolic BP Percentile --      Pulse --      Resp 08/06/23 1207 20     Temp 08/06/23 1207 98.6 F (37 C)     Temp Source 08/06/23 1207 Oral     SpO2 08/06/23 1207 100 %     Weight 08/06/23 1206 145 lb (65.8 kg)     Height 08/06/23 1206 5' (1.524 m)     Head Circumference --      Peak Flow --      Pain Score 08/06/23 1204 5     Pain Loc --      Pain Education --      Exclude from Growth Chart --     Most recent vital signs: Vitals:   08/06/23 1207 08/06/23 1538  BP: (!) 129/100 (!) 135/96  Pulse:  90  Resp: 20 16  Temp: 98.6 F (37 C) 98.6 F (37 C)  SpO2: 100% 100%     General: Awake, no distress.  Well-appearing. HEENT:  Moderate swelling beneath right jaw, no pooling of secretions, no trismus, no uvular deviation.  Does have a previously removed area that looks inflamed over right posterior molar, no surrounding fluctuance.  No obvious voice change.  No stridor. CV:  Good peripheral perfusion. RRR, RP 2+ Resp:  Normal effort. CTAB    ED Results / Procedures / Treatments   Labs (all labs ordered are listed, but only  abnormal results are displayed) Labs Reviewed  CBC WITH DIFFERENTIAL/PLATELET  BASIC METABOLIC PANEL WITH GFR  HCG, QUANTITATIVE, PREGNANCY     EKG  N/a   RADIOLOGY See ED course below  PROCEDURES:  Critical Care performed: No  Procedures   MEDICATIONS ORDERED IN ED: Medications  amoxicillin -clavulanate (AUGMENTIN ) 875-125 MG per tablet 1 tablet (has no administration in time range)  iohexol  (OMNIPAQUE ) 300 MG/ML solution 75 mL (75 mLs Intravenous Contrast Given 08/06/23 1354)     IMPRESSION / MDM / ASSESSMENT AND PLAN / ED COURSE  I reviewed the triage vital signs and the nursing notes.                              DDX/MDM/AP: Differential diagnosis includes, but is not limited to, likely dental infection with associated lymphadenopathy, cannot rule out early development of underlying abscess.  Do not suspect genuine deep neck space infection at this time we will consider possibility of progression if underlying abscess.  Less likely sialoadenitis.  Plan: -  Labs  -CT neck with contrast - Reassess  Patient's presentation is most consistent with acute presentation with potential threat to life or bodily function.  ED course below.  Workup with prominent dental caries and possible periapical abscess.  No drainable pocket on my eval and fortunately no deep underlying abscess.  +surrounding inflammatory changes.  Started on Augmentin  with plan for close dental follow-up.  ED return precautions in place.  Patient agrees with plan.  First dose of Augmentin  in the emergency department.  Rx Tylenol , Motrin  in addition for ongoing pain control.  Clinical Course as of 08/06/23 1608  Sun Aug 06, 2023  1323 Cbc, bmp wnl Hcg neg [MM]  1603 CT neck with no obvious abscess on my interpretation  Radiology report below: IMPRESSION: 1. Prominent dental caries within the first right mandibular molar with periapical lucency about the tooth roots and breech of the lateral cortex of  the mandible. 2. Extensive inflammatory changes extend out from this area without a discrete abscess. The periapical abscess is likely the source of the soft tissue inflammatory changes. 3. Asymmetric right submandibular and level 2 lymph nodes are likely reactive. No suppurative nodes are present.   [MM]    Clinical Course User Index [MM] Collis Deaner, MD     FINAL CLINICAL IMPRESSION(S) / ED DIAGNOSES   Final diagnoses:  Dental infection  Right facial swelling     Rx / DC Orders   ED Discharge Orders          Ordered    amoxicillin -clavulanate (AUGMENTIN ) 875-125 MG tablet  2 times daily        08/06/23 1604    acetaminophen  (TYLENOL ) 500 MG tablet  Every 6 hours PRN        08/06/23 1606    ibuprofen  (MOTRIN  IB) 200 MG tablet  Every 8 hours PRN        08/06/23 1606             Note:  This document was prepared using Dragon voice recognition software and may include unintentional dictation errors.   Collis Deaner, MD 08/06/23 [phone]

## 2023-11-12 ENCOUNTER — Emergency Department
Admission: EM | Admit: 2023-11-12 | Discharge: 2023-11-12 | Disposition: A | Discharge status: Home / Self Care | Attending: Emergency Medicine | Admitting: Emergency Medicine

## 2023-11-12 ENCOUNTER — Other Ambulatory Visit: Payer: Self-pay

## 2023-11-12 DIAGNOSIS — R319 Hematuria, unspecified: Secondary | ICD-10-CM | POA: Diagnosis present

## 2023-11-12 DIAGNOSIS — D72829 Elevated white blood cell count, unspecified: Secondary | ICD-10-CM | POA: Insufficient documentation

## 2023-11-12 DIAGNOSIS — N3001 Acute cystitis with hematuria: Secondary | ICD-10-CM | POA: Insufficient documentation

## 2023-11-12 LAB — CBC
HCT: 38.8 % (ref 36.0–46.0)
Hemoglobin: 13 g/dL (ref 12.0–15.0)
MCH: 28.6 pg (ref 26.0–34.0)
MCHC: 33.5 g/dL (ref 30.0–36.0)
MCV: 85.3 fL (ref 80.0–100.0)
Platelets: 238 K/uL (ref 150–400)
RBC: 4.55 MIL/uL (ref 3.87–5.11)
RDW: 13 % (ref 11.5–15.5)
WBC: 10.6 K/uL — ABNORMAL HIGH (ref 4.0–10.5)
nRBC: 0 % (ref 0.0–0.2)

## 2023-11-12 LAB — BASIC METABOLIC PANEL WITH GFR
Anion gap: 12 (ref 5–15)
BUN: 14 mg/dL (ref 6–20)
CO2: 26 mmol/L (ref 22–32)
Calcium: 9 mg/dL (ref 8.9–10.3)
Chloride: 104 mmol/L (ref 98–111)
Creatinine, Ser: 0.68 mg/dL (ref 0.44–1.00)
GFR, Estimated: >60 mL/min (ref >60)
Glucose, Bld: 122 mg/dL — ABNORMAL HIGH (ref 70–99)
Potassium: 4.2 mmol/L (ref 3.5–5.1)
Sodium: 142 mmol/L (ref 135–145)

## 2023-11-12 LAB — URINALYSIS, ROUTINE W REFLEX MICROSCOPIC
Bilirubin Urine: NEGATIVE
Glucose, UA: NEGATIVE mg/dL
Ketones, ur: NEGATIVE mg/dL
Nitrite: NEGATIVE
Protein, ur: 100 mg/dL — AB
RBC / HPF: >50 RBC/hpf (ref 0–5)
Specific Gravity, Urine: 1.02 (ref 1.005–1.030)
WBC, UA: >50 WBC/hpf (ref 0–5)
pH: 5 (ref 5.0–8.0)

## 2023-11-12 MED ORDER — CEPHALEXIN 500 MG PO CAPS: 500.0000 mg | ORAL_CAPSULE | Freq: Three times a day (TID) | ORAL | 0 refills | Status: AC

## 2023-11-12 NOTE — ED Provider Notes (Signed)
 [facility] Provider Note    Event Date/Time   First MD Initiated Contact with Patient 11/12/23 1012     (approximate)   History   Hematuria   HPI  Stefanie Mason is a 38 y.o. female with history of chronic back pain and as listed in EMR presents to the emergency department for treatment and evaluation of hematuria and suprapubic pain.  Symptoms started Friday morning.  History of hysterectomy and denies vaginal bleeding. Denies concern for STI.     Physical Exam    Vitals:   11/12/23 1002  BP: 120/89  Pulse: 89  Resp: 16  Temp: 98.3 F (36.8 C)  SpO2: 100%    General: Awake, no distress.  CV:  Good peripheral perfusion.  Resp:  Normal effort.  Abd:  No distention. Suprapubic tenderness. Other:     ED Results / Procedures / Treatments   Labs (all labs ordered are listed, but only abnormal results are displayed)  Labs Reviewed  URINALYSIS, ROUTINE W REFLEX MICROSCOPIC - Abnormal; Notable for the following components:      Result Value   Color, Urine YELLOW (*)    APPearance TURBID (*)    Hgb urine dipstick LARGE (*)    Protein, ur 100 (*)    Leukocytes,Ua MODERATE (*)    Bacteria, UA MANY (*)    All other components within normal limits  CBC - Abnormal; Notable for the following components:   WBC 10.6 (*)    All other components within normal limits  BASIC METABOLIC PANEL WITH GFR - Abnormal; Notable for the following components:   Glucose, Bld 122 (*)    All other components within normal limits  URINE CULTURE     EKG  Not indicated.   RADIOLOGY  Image and radiology report reviewed and interpreted by me. Radiology report consistent with the same.  Not indicated.  PROCEDURES:  Critical Care performed: No  Procedures   MEDICATIONS ORDERED IN ED:  Medications - No data to display   IMPRESSION / MDM / ASSESSMENT AND PLAN / ED COURSE   I have reviewed the triage note and vital signs. Vital signs are  stable.   Differential diagnosis includes, but is not limited to, acute cystitis, STI, interstitial cystitis, vaginal bleeding  Patient's presentation is most consistent with acute illness / injury with system symptoms.  38 year old female presenting to the emergency department for treatment and evaluation of hematuria that she noticed this morning.  She denies history of the same.  She has had urinary tract infections in the past but has been a long time.  She does not recall the last time she was on antibiotic. No history of kidney stone. No back pain different than chronic pain. See HPI for further details.  Urinalysis and lab studies are pending.  Exam is overall reassuring the exception of some suprapubic tenderness.  Plan will be to await results of labs and proceed as indicated.  Mild leukocytosis.  Urinalysis shows turbid specimen with large amount of hemoglobin, 100 protein, moderate leukocytes, greater than 50 red blood cells, greater than 50 white blood cells, many bacteria with white blood cell clumps.  Squamous epithelial cells are present as well.  BMP shows normal kidney function with a GFR of greater than 60.  Plan will be to discharge her home with 5 days of Keflex .  She will be encouraged to follow-up with her primary care provider if her symptoms are not improving over the next few days.  If her symptoms change or worsen and if she is unable to see her primary care provider, she was encouraged to return to the emergency department.      FINAL CLINICAL IMPRESSION(S) / ED DIAGNOSES   Final diagnoses:  Acute cystitis with hematuria     Rx / DC Orders   ED Discharge Orders          Ordered    cephALEXin  (KEFLEX ) 500 MG capsule  3 times daily        11/12/23 1107             Note:  This document was prepared using Dragon voice recognition software and may include unintentional dictation errors.   Herlinda Kirk NOVAK, FNP 11/12/23 1108    Dicky Anes,  MD 11/12/23 [phone]

## 2023-11-12 NOTE — ED Triage Notes (Signed)
 Pt to ED for c/o blood in urine that began this morning. States urinary frequency, denies burning, itching. States mild abd tenderness. NAD

## 2023-11-12 NOTE — Discharge Instructions (Signed)
 Take the antibiotic as prescribed and until finished.  Follow-up with your primary care provider if not improving over the next 48 hours.  If you are not improving or if your symptoms change or worsen and you are unable to get an appointment, please return to the emergency department.

## 2023-11-13 LAB — URINE CULTURE: Culture: <10000 — AB
